# Patient Record
Sex: Female | Born: 1990 | Race: Asian | Hispanic: No | Marital: Married | Smoking: Never smoker
Health system: Southern US, Community
[De-identification: ages and names within clinical notes are randomized; demographics above are authoritative.]

## PROBLEM LIST (undated history)

## (undated) DIAGNOSIS — Z789 Other specified health status: Secondary | ICD-10-CM

## (undated) HISTORY — PX: TONSILLECTOMY: SHX5217

---
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
## 2012-01-05 NOTE — L&D Delivery Note (Signed)
Attestation of Attending Supervision of Advanced Practitioner (CNM/NP): Evaluation and management procedures were performed by the Advanced Practitioner under my supervision and collaboration. I have reviewed the Advanced Practitioner's note and chart, and I agree with the management and plan.  Amber Hubbard H. 1:31 PM   

## 2012-01-05 NOTE — L&D Delivery Note (Signed)
Delivery Note At 4:15 PM a viable female was delivered via Vaginal, Spontaneous Delivery (Presentation: Left Occiput Anterior) w/ posterior (left) compound hand.  APGAR: pending at time of note, baby w/ spontaneous respiration and cry at time of birth ; weight: not available at time of note  .   Placenta status: Intact, Spontaneous.  Cord: 3 vessels with the following complications: None.  Cord pH: not done  Anesthesia: Epidural  Episiotomy: None Lacerations: bilateral hemostatic 1st degree periurethral, and hemostatic Lt lower inner labial- w/o repair Suture Repair: n/a Est. Blood Loss (mL):  Mom to postpartum.  Baby to nursery-stable. Plans to breastfeed, mirena for contraception  Marge Duncans 07/08/2012, 4:37 PM

## 2012-01-12 ENCOUNTER — Ambulatory Visit (INDEPENDENT_AMBULATORY_CARE_PROVIDER_SITE_OTHER): Payer: Self-pay | Admitting: Emergency Medicine

## 2012-01-12 ENCOUNTER — Encounter: Payer: Self-pay | Admitting: Emergency Medicine

## 2012-01-12 VITALS — BP 112/76 | HR 92 | Ht 59.0 in | Wt 95.0 lb

## 2012-01-12 DIAGNOSIS — Z3400 Encounter for supervision of normal first pregnancy, unspecified trimester: Secondary | ICD-10-CM

## 2012-01-12 DIAGNOSIS — O210 Mild hyperemesis gravidarum: Secondary | ICD-10-CM

## 2012-01-12 NOTE — Assessment & Plan Note (Signed)
She will make a lab appt for prenatal labs once she gets her Medicaid card and then follow up with me for routine OB care.

## 2012-01-12 NOTE — Patient Instructions (Addendum)
When you get your Medicaid card, make a lab appt for prenatal labs.  Bring your medicaid card to this appt.  Follow up with me after the labs.

## 2012-01-12 NOTE — Assessment & Plan Note (Signed)
Much improved now that she is out of the first trimester.  Weight is up 1.5lbs from last week.

## 2012-01-12 NOTE — Progress Notes (Signed)
  Subjective:    Patient ID: Amber Hubbard, female    DOB: September 06, 1990, 22 y.o.   MRN: [id number]  HPI Amber Hubbard is here for f/u of hyperemesis gravidarium.  She is a G1P0 at [redacted]w[redacted]d by LMP and early ultrasound.  She reports her nausea and vomiting is significantly improved, despite not taking any medications for nausea.  Able to eat a wider variety of foods.  No vaginal bleeding or discharge.  Cramping has improved with improved vomiting.  She states she should get her Medicaid card on the 10th.  I have reviewed and updated the following as appropriate: allergies and current medications SHx: never smoker  Review of Systems See HPI    Objective:   Physical Exam BP 112/76  Pulse 92  Ht 4\' 11"  (1.499 m)  Wt 95 lb (43.092 kg)  BMI 19.19 kg/m2  LMP 10/09/2011 Gen: alert, cooperative, NAD FHT: 160     Assessment & Plan:

## 2012-02-02 ENCOUNTER — Other Ambulatory Visit: Payer: Self-pay

## 2012-02-04 ENCOUNTER — Other Ambulatory Visit: Payer: Medicaid Other

## 2012-02-04 DIAGNOSIS — Z331 Pregnant state, incidental: Secondary | ICD-10-CM

## 2012-02-04 NOTE — Progress Notes (Signed)
PRENATAL LABS DONE TODAY Amber Hubbard 

## 2012-02-07 LAB — OBSTETRIC PANEL
Eosinophils Absolute: 0 10*3/uL (ref 0.0–0.7)
Hemoglobin: 12.3 g/dL (ref 12.0–15.0)
Hepatitis B Surface Ag: NEGATIVE
Lymphocytes Relative: 23 % (ref 12–46)
Lymphs Abs: 2.1 10*3/uL (ref 0.7–4.0)
MCH: 24.9 pg — ABNORMAL LOW (ref 26.0–34.0)
Monocytes Relative: 5 % (ref 3–12)
Neutro Abs: 6.3 10*3/uL (ref 1.7–7.7)
Neutrophils Relative %: 71 % (ref 43–77)
Platelets: 265 10*3/uL (ref 150–400)
RBC: 4.94 MIL/uL (ref 3.87–5.11)
Rh Type: POSITIVE
Rubella: 0.94 Index — ABNORMAL HIGH (ref <0.90)
WBC: 8.8 10*3/uL (ref 4.0–10.5)

## 2012-02-11 ENCOUNTER — Ambulatory Visit (INDEPENDENT_AMBULATORY_CARE_PROVIDER_SITE_OTHER): Payer: Medicaid Other | Admitting: Emergency Medicine

## 2012-02-11 ENCOUNTER — Other Ambulatory Visit (HOSPITAL_COMMUNITY)
Admission: RE | Admit: 2012-02-11 | Discharge: 2012-02-11 | Disposition: A | Discharge status: Home / Self Care | Payer: Medicaid Other | Source: Ambulatory Visit | Attending: Family Medicine | Admitting: Family Medicine

## 2012-02-11 VITALS — BP 112/70 | Wt 98.0 lb

## 2012-02-11 DIAGNOSIS — Z3400 Encounter for supervision of normal first pregnancy, unspecified trimester: Secondary | ICD-10-CM

## 2012-02-11 DIAGNOSIS — Z113 Encounter for screening for infections with a predominantly sexual mode of transmission: Secondary | ICD-10-CM | POA: Insufficient documentation

## 2012-02-11 NOTE — Patient Instructions (Addendum)
Pregnancy - Second Trimester The second trimester of pregnancy (3 to 6 months) is a period of rapid growth for you and your baby. At the end of the sixth month, your baby is about 9 inches long and weighs 1 1/2 pounds. You will begin to feel the baby move between 18 and 20 weeks of the pregnancy. This is called quickening. Weight gain is faster. A clear fluid (colostrum) may leak out of your breasts. You may feel small contractions of the womb (uterus). This is known as false labor or Braxton-Hicks contractions. This is like a practice for labor when the baby is ready to be born. Usually, the problems with morning sickness have usually passed by the end of your first trimester. Some women develop small dark blotches (called cholasma, mask of pregnancy) on their face that usually goes away after the baby is born. Exposure to the sun makes the blotches worse. Acne may also develop in some pregnant women and pregnant women who have acne, may find that it goes away. PRENATAL EXAMS  Blood work may continue to be done during prenatal exams. These tests are done to check on your health and the probable health of your baby. Blood work is used to follow your blood levels (hemoglobin). Anemia (low hemoglobin) is common during pregnancy. Iron and vitamins are given to help prevent this. You will also be checked for diabetes between 24 and 28 weeks of the pregnancy. Some of the previous blood tests may be repeated.  The size of the uterus is measured during each visit. This is to make sure that the baby is continuing to grow properly according to the dates of the pregnancy.  Your blood pressure is checked every prenatal visit. This is to make sure you are not getting toxemia.  Your urine is checked to make sure you do not have an infection, diabetes or protein in the urine.  Your weight is checked often to make sure gains are happening at the suggested rate. This is to ensure that both you and your baby are growing  normally.  Sometimes, an ultrasound is performed to confirm the proper growth and development of the baby. This is a test which bounces harmless sound waves off the baby so your caregiver can more accurately determine due dates. Sometimes, a specialized test is done on the amniotic fluid surrounding the baby. This test is called an amniocentesis. The amniotic fluid is obtained by sticking a needle into the belly (abdomen). This is done to check the chromosomes in instances where there is a concern about possible genetic problems with the baby. It is also sometimes done near the end of pregnancy if an early delivery is required. In this case, it is done to help make sure the baby's lungs are mature enough for the baby to live outside of the womb. CHANGES OCCURING IN THE SECOND TRIMESTER OF PREGNANCY Your body goes through many changes during pregnancy. They vary from person to person. Talk to your caregiver about changes you notice that you are concerned about.  During the second trimester, you will likely have an increase in your appetite. It is normal to have cravings for certain foods. This varies from person to person and pregnancy to pregnancy.  Your lower abdomen will begin to bulge.  You may have to urinate more often because the uterus and baby are pressing on your bladder. It is also common to get more bladder infections during pregnancy (pain with urination). You can help this by   drinking lots of fluids and emptying your bladder before and after intercourse.  You may begin to get stretch marks on your hips, abdomen, and breasts. These are normal changes in the body during pregnancy. There are no exercises or medications to take that prevent this change.  You may begin to develop swollen and bulging veins (varicose veins) in your legs. Wearing support hose, elevating your feet for 15 minutes, 3 to 4 times a day and limiting salt in your diet helps lessen the problem.  Heartburn may develop  as the uterus grows and pushes up against the stomach. Antacids recommended by your caregiver helps with this problem. Also, eating smaller meals 4 to 5 times a day helps.  Constipation can be treated with a stool softener or adding bulk to your diet. Drinking lots of fluids, vegetables, fruits, and whole grains are helpful.  Exercising is also helpful. If you have been very active up until your pregnancy, most of these activities can be continued during your pregnancy. If you have been less active, it is helpful to start an exercise program such as walking.  Hemorrhoids (varicose veins in the rectum) may develop at the end of the second trimester. Warm sitz baths and hemorrhoid cream recommended by your caregiver helps hemorrhoid problems.  Backaches may develop during this time of your pregnancy. Avoid heavy lifting, wear low heal shoes and practice good posture to help with backache problems.  Some pregnant women develop tingling and numbness of their hand and fingers because of swelling and tightening of ligaments in the wrist (carpel tunnel syndrome). This goes away after the baby is born.  As your breasts enlarge, you may have to get a bigger bra. Get a comfortable, cotton, support bra. Do not get a nursing bra until the last month of the pregnancy if you will be nursing the baby.  You may get a dark line from your belly button to the pubic area called the linea nigra.  You may develop rosy cheeks because of increase blood flow to the face.  You may develop spider looking lines of the face, neck, arms and chest. These go away after the baby is born. HOME CARE INSTRUCTIONS   It is extremely important to avoid all smoking, herbs, alcohol, and unprescribed drugs during your pregnancy. These chemicals affect the formation and growth of the baby. Avoid these chemicals throughout the pregnancy to ensure the delivery of a healthy infant.  Most of your home care instructions are the same as  suggested for the first trimester of your pregnancy. Keep your caregiver's appointments. Follow your caregiver's instructions regarding medication use, exercise and diet.  During pregnancy, you are providing food for you and your baby. Continue to eat regular, well-balanced meals. Choose foods such as meat, fish, milk and other low fat dairy products, vegetables, fruits, and whole-grain breads and cereals. Your caregiver will tell you of the ideal weight gain.  A physical sexual relationship may be continued up until near the end of pregnancy if there are no other problems. Problems could include early (premature) leaking of amniotic fluid from the membranes, vaginal bleeding, abdominal pain, or other medical or pregnancy problems.  Exercise regularly if there are no restrictions. Check with your caregiver if you are unsure of the safety of some of your exercises. The greatest weight gain will occur in the last 2 trimesters of pregnancy. Exercise will help you:  Control your weight.  Get you in shape for labor and delivery.  Lose weight   after you have the baby.  Wear a good support or jogging bra for breast tenderness during pregnancy. This may help if worn during sleep. Pads or tissues may be used in the bra if you are leaking colostrum.  Do not use hot tubs, steam rooms or saunas throughout the pregnancy.  Wear your seat belt at all times when driving. This protects you and your baby if you are in an accident.  Avoid raw meat, uncooked cheese, cat litter boxes and soil used by cats. These carry germs that can cause birth defects in the baby.  The second trimester is also a good time to visit your dentist for your dental health if this has not been done yet. Getting your teeth cleaned is OK. Use a soft toothbrush. Brush gently during pregnancy.  It is easier to loose urine during pregnancy. Tightening up and strengthening the pelvic muscles will help with this problem. Practice stopping your  urination while you are going to the bathroom. These are the same muscles you need to strengthen. It is also the muscles you would use as if you were trying to stop from passing gas. You can practice tightening these muscles up 10 times a set and repeating this about 3 times per day. Once you know what muscles to tighten up, do not perform these exercises during urination. It is more likely to contribute to an infection by backing up the urine.  Ask for help if you have financial, counseling or nutritional needs during pregnancy. Your caregiver will be able to offer counseling for these needs as well as refer you for other special needs.  Your skin may become oily. If so, wash your face with mild soap, use non-greasy moisturizer and oil or cream based makeup. MEDICATIONS AND DRUG USE IN PREGNANCY  Take prenatal vitamins as directed. The vitamin should contain 1 milligram of folic acid. Keep all vitamins out of reach of children. Only a couple vitamins or tablets containing iron may be fatal to a baby or young child when ingested.  Avoid use of all medications, including herbs, over-the-counter medications, not prescribed or suggested by your caregiver. Only take over-the-counter or prescription medicines for pain, discomfort, or fever as directed by your caregiver. Do not use aspirin.  Let your caregiver also know about herbs you may be using.  Alcohol is related to a number of birth defects. This includes fetal alcohol syndrome. All alcohol, in any form, should be avoided completely. Smoking will cause low birth rate and premature babies.  Street or illegal drugs are very harmful to the baby. They are absolutely forbidden. A baby born to an addicted mother will be addicted at birth. The baby will go through the same withdrawal an adult does. SEEK MEDICAL CARE IF:  You have any concerns or worries during your pregnancy. It is better to call with your questions if you feel they cannot wait, rather  than worry about them. SEEK IMMEDIATE MEDICAL CARE IF:   An unexplained oral temperature above 102 F (38.9 C) develops, or as your caregiver suggests.  You have leaking of fluid from the vagina (birth canal). If leaking membranes are suspected, take your temperature and tell your caregiver of this when you call.  There is vaginal spotting, bleeding, or passing clots. Tell your caregiver of the amount and how many pads are used. Light spotting in pregnancy is common, especially following intercourse.  You develop a bad smelling vaginal discharge with a change in the color from clear   to white.  You continue to feel sick to your stomach (nauseated) and have no relief from remedies suggested. You vomit blood or coffee ground-like materials.  You lose more than 2 pounds of weight or gain more than 2 pounds of weight over 1 week, or as suggested by your caregiver.  You notice swelling of your face, hands, feet, or legs.  You get exposed to German measles and have never had them.  You are exposed to fifth disease or chickenpox.  You develop belly (abdominal) pain. Round ligament discomfort is a common non-cancerous (benign) cause of abdominal pain in pregnancy. Your caregiver still must evaluate you.  You develop a bad headache that does not go away.  You develop fever, diarrhea, pain with urination, or shortness of breath.  You develop visual problems, blurry, or double vision.  You fall or are in a car accident or any kind of trauma.  There is mental or physical violence at home. Document Released: 12/15/2000 Document Revised: 03/15/2011 Document Reviewed: 06/19/2008 [employer] Patient Information 2013 [employer].  

## 2012-02-11 NOTE — Addendum Note (Signed)
Addended byArlyss Repress on: 02/11/2012 04:25 PM   Modules accepted: Orders

## 2012-02-11 NOTE — Progress Notes (Signed)
S: 22 yo G1 at [redacted]w[redacted]d by LMP and early ultrasound presenting for routine care/initial ob appt.  No vaginal bleeding.  Moderate amount of thin white discharge without odor or itching.  Starting to feel some flutters, but unsure if it is the baby or her stomach.  No further n/v.   PMHx: negative PSHx: negative Social: no alcohol, tobacco or drugs  PMH form reviewed and no red flags. PHQ-9: 6 (3 for fatigue, 1 for anhedonia, sleep, and concentration)  O: see flowsheet Back: no erythema, swelling, obvious deformity; vertebra non-tender, tender to palpation along left paraspinous muscles and above left iliac crest Pelvic: normal external genitalia, normal vagina, physiologic discharge noted, normal cervix with extropion  A/P: 22 yo G1 at [redacted]w[redacted]d by LMP and early ultrasound - continue prenatal vitamin - discussed weight gain of about 30lbs during pregnancy - no indication for early glucola - cervical cultures collected today - prenatal labs reviewed: MMR equivocal, discussed avoiding people with rash and fever, will immunize PP - information on teen mom programs provided - anatomy scan scheduled - will flu shot at next appt - f/u in 4 weeks in [facility] clinic

## 2012-02-17 ENCOUNTER — Ambulatory Visit (HOSPITAL_COMMUNITY): Admission: RE | Admit: 2012-02-17 | Payer: Medicaid Other | Source: Ambulatory Visit

## 2012-02-22 ENCOUNTER — Ambulatory Visit (HOSPITAL_COMMUNITY)
Admission: RE | Admit: 2012-02-22 | Discharge: 2012-02-22 | Disposition: A | Discharge status: Home / Self Care | Payer: Medicaid Other | Source: Ambulatory Visit | Attending: Family Medicine | Admitting: Family Medicine

## 2012-02-22 ENCOUNTER — Other Ambulatory Visit: Payer: Self-pay | Admitting: Emergency Medicine

## 2012-02-22 DIAGNOSIS — Z363 Encounter for antenatal screening for malformations: Secondary | ICD-10-CM | POA: Insufficient documentation

## 2012-02-22 DIAGNOSIS — Z3400 Encounter for supervision of normal first pregnancy, unspecified trimester: Secondary | ICD-10-CM

## 2012-02-22 DIAGNOSIS — Z1389 Encounter for screening for other disorder: Secondary | ICD-10-CM | POA: Insufficient documentation

## 2012-02-22 DIAGNOSIS — O358XX0 Maternal care for other (suspected) fetal abnormality and damage, not applicable or unspecified: Secondary | ICD-10-CM | POA: Insufficient documentation

## 2012-03-20 ENCOUNTER — Ambulatory Visit (INDEPENDENT_AMBULATORY_CARE_PROVIDER_SITE_OTHER): Payer: Medicaid Other | Admitting: Emergency Medicine

## 2012-03-20 VITALS — BP 111/71 | Wt 105.0 lb

## 2012-03-20 DIAGNOSIS — R1011 Right upper quadrant pain: Secondary | ICD-10-CM

## 2012-03-20 DIAGNOSIS — Z3402 Encounter for supervision of normal first pregnancy, second trimester: Secondary | ICD-10-CM

## 2012-03-20 DIAGNOSIS — Z3400 Encounter for supervision of normal first pregnancy, unspecified trimester: Secondary | ICD-10-CM

## 2012-03-20 LAB — CBC
Hemoglobin: 12 g/dL (ref 12.0–15.0)
MCH: 25.5 pg — ABNORMAL LOW (ref 26.0–34.0)
MCHC: 33.5 g/dL (ref 30.0–36.0)
Platelets: 288 10*3/uL (ref 150–400)
RBC: 4.71 MIL/uL (ref 3.87–5.11)

## 2012-03-20 LAB — COMPREHENSIVE METABOLIC PANEL
Albumin: 4.1 g/dL (ref 3.5–5.2)
Alkaline Phosphatase: 70 U/L (ref 39–117)
BUN: 10 mg/dL (ref 6–23)
CO2: 29 mEq/L (ref 19–32)
Calcium: 9.2 mg/dL (ref 8.4–10.5)
Chloride: 101 mEq/L (ref 96–112)
Glucose, Bld: 73 mg/dL (ref 70–99)
Potassium: 4 mEq/L (ref 3.5–5.3)
Sodium: 136 mEq/L (ref 135–145)
Total Protein: 6.9 g/dL (ref 6.0–8.3)

## 2012-03-20 NOTE — Progress Notes (Signed)
S: 22 yo G1P0 at [redacted]w[redacted]d by LMP.  Complaining of RUQ pain for the last week or so, described as colicky, occurs after meals, lasts for about 2 hours then resolves; no fevers, n/v.  +FM.  No vaginal bleeding, discharge, or cramping.   O: see flowsheet Abd: gravid, soft, ND, tender in RUQ  A/P: 22 yo G1P0 at [redacted]w[redacted]d by LMP - continue prenatal vitamin - anatomy scan reviewed and wnl - RUQ concerning for gallstones - will check CMP, CBC, and abd ultrasound - discussed smaller meals and avoiding fatty foods - reviewed bleeding precautions and warning signs for gallbladder disease - follow up in 4 weeks in OB clinic or sooner if needed

## 2012-03-20 NOTE — Patient Instructions (Addendum)
It was nice to see you! I think you pain is from your gallbladder.  We are going to check an ultrasound of the gallbladder and some labs. If the pain is worse, does not get better, you have fevers, your eyes start to look yellow, or you have nausea and vomiting, please come back or go to the ER. If you have abdominal cramping and vaginal bleeding, please go to [facility] to be evaluated. Follow up in 4 weeks in [facility], or sooner if needed.

## 2012-03-23 ENCOUNTER — Ambulatory Visit (HOSPITAL_COMMUNITY)
Admission: RE | Admit: 2012-03-23 | Discharge: 2012-03-23 | Disposition: A | Discharge status: Home / Self Care | Payer: Medicaid Other | Source: Ambulatory Visit | Attending: Family Medicine | Admitting: Family Medicine

## 2012-03-23 ENCOUNTER — Telehealth: Payer: Self-pay | Admitting: Emergency Medicine

## 2012-03-23 DIAGNOSIS — O99891 Other specified diseases and conditions complicating pregnancy: Secondary | ICD-10-CM | POA: Insufficient documentation

## 2012-03-23 DIAGNOSIS — N2889 Other specified disorders of kidney and ureter: Secondary | ICD-10-CM | POA: Insufficient documentation

## 2012-03-23 DIAGNOSIS — R1011 Right upper quadrant pain: Secondary | ICD-10-CM | POA: Insufficient documentation

## 2012-03-23 MED ORDER — RANITIDINE HCL 300 MG PO TABS: 300.0000 mg | ORAL_TABLET | Freq: Every day | ORAL | Status: DC

## 2012-03-23 NOTE — Telephone Encounter (Signed)
Called to discuss lab results with patient.  Lab test and ultrasound show no problems with the gallbladder or liver.  She continues to have pain after eating or drinking anything.  She thinks it is getting worse.  She has an appt scheduled with OB clinic 4/10.  I will send in a prescription for Zantac to treat presumptive heartburn/gastritis.  She is also to call the front desk for an appt to be seen next week.  She expressed understanding and agreement.

## 2012-04-13 ENCOUNTER — Ambulatory Visit (INDEPENDENT_AMBULATORY_CARE_PROVIDER_SITE_OTHER): Payer: Medicaid Other | Admitting: Family Medicine

## 2012-04-13 ENCOUNTER — Encounter: Payer: Self-pay | Admitting: Family Medicine

## 2012-04-13 ENCOUNTER — Other Ambulatory Visit: Payer: Self-pay | Admitting: Family Medicine

## 2012-04-13 VITALS — BP 115/60 | Temp 98.1°F | Wt 110.0 lb

## 2012-04-13 DIAGNOSIS — R1011 Right upper quadrant pain: Secondary | ICD-10-CM

## 2012-04-13 DIAGNOSIS — Z3402 Encounter for supervision of normal first pregnancy, second trimester: Secondary | ICD-10-CM

## 2012-04-13 DIAGNOSIS — O0001 Abdominal pregnancy with intrauterine pregnancy: Secondary | ICD-10-CM

## 2012-04-13 DIAGNOSIS — R1013 Epigastric pain: Secondary | ICD-10-CM | POA: Insufficient documentation

## 2012-04-13 DIAGNOSIS — Z3400 Encounter for supervision of normal first pregnancy, unspecified trimester: Secondary | ICD-10-CM

## 2012-04-13 LAB — RPR

## 2012-04-13 LAB — CBC
Hemoglobin: 11.5 g/dL — ABNORMAL LOW (ref 12.0–15.0)
MCH: 25.7 pg — ABNORMAL LOW (ref 26.0–34.0)
MCHC: 33.5 g/dL (ref 30.0–36.0)
Platelets: 263 10*3/uL (ref 150–400)
RBC: 4.48 MIL/uL (ref 3.87–5.11)

## 2012-04-13 LAB — HIV ANTIBODY (ROUTINE TESTING W REFLEX): HIV: NONREACTIVE

## 2012-04-13 LAB — GLUCOSE, CAPILLARY: Glucose-Capillary: 77 mg/dL (ref 70–99)

## 2012-04-13 MED ORDER — RANITIDINE HCL 150 MG PO TABS: 150.0000 mg | ORAL_TABLET | Freq: Two times a day (BID) | ORAL | Status: DC

## 2012-04-13 NOTE — Assessment & Plan Note (Addendum)
Patient with mostly epigastric pain associated with eating. Likely related to GERD as patient had Abd [place] negative for gall stones. Plan: will start zantac 150 mg BID and continue to follow

## 2012-04-13 NOTE — Patient Instructions (Signed)
Pregnancy - Third Trimester  The third trimester of pregnancy (the last 3 months) is a period of the most rapid growth for you and your baby. The baby approaches a length of 20 inches and a weight of 6 to 10 pounds. The baby is adding on fat and getting ready for life outside your body. While inside, babies have periods of sleeping and waking, suck their thumbs, and hiccups. You can often feel small contractions of the uterus. This is false labor. It is also called Braxton-Hicks contractions. This is like a practice for labor. The usual problems in this stage of pregnancy include more difficulty breathing, swelling of the hands and feet from water retention, and having to urinate more often because of the uterus and baby pressing on your bladder.   PRENATAL EXAMS  · Blood work may continue to be done during prenatal exams. These tests are done to check on your health and the probable health of your baby. Blood work is used to follow your blood levels (hemoglobin). Anemia (low hemoglobin) is common during pregnancy. Iron and vitamins are given to help prevent this. You may also continue to be checked for diabetes. Some of the past blood tests may be done again.  · The size of the uterus is measured during each visit. This makes sure your baby is growing properly according to your pregnancy dates.  · Your blood pressure is checked every prenatal visit. This is to make sure you are not getting toxemia.  · Your urine is checked every prenatal visit for infection, diabetes and protein.  · Your weight is checked at each visit. This is done to make sure gains are happening at the suggested rate and that you and your baby are growing normally.  · Sometimes, an ultrasound is performed to confirm the position and the proper growth and development of the baby. This is a test done that bounces harmless sound waves off the baby so your caregiver can more accurately determine due dates.  · Discuss the type of pain medication and  anesthesia you will have during your labor and delivery.  · Discuss the possibility and anesthesia if a Cesarean Section might be necessary.  · Inform your caregiver if there is any mental or physical violence at home.  Sometimes, a specialized non-stress test, contraction stress test and biophysical profile are done to make sure the baby is not having a problem. Checking the amniotic fluid surrounding the baby is called an amniocentesis. The amniotic fluid is removed by sticking a needle into the belly (abdomen). This is sometimes done near the end of pregnancy if an early delivery is required. In this case, it is done to help make sure the baby's lungs are mature enough for the baby to live outside of the womb. If the lungs are not mature and it is unsafe to deliver the baby, an injection of cortisone medication is given to the mother 1 to 2 days before the delivery. This helps the baby's lungs mature and makes it safer to deliver the baby.  CHANGES OCCURING IN THE THIRD TRIMESTER OF PREGNANCY  Your body goes through many changes during pregnancy. They vary from person to person. Talk to your caregiver about changes you notice and are concerned about.  · During the last trimester, you have probably had an increase in your appetite. It is normal to have cravings for certain foods. This varies from person to person and pregnancy to pregnancy.  · You may begin to   get stretch marks on your hips, abdomen, and breasts. These are normal changes in the body during pregnancy. There are no exercises or medications to take which prevent this change.  · Constipation may be treated with a stool softener or adding bulk to your diet. Drinking lots of fluids, fiber in vegetables, fruits, and whole grains are helpful.  · Exercising is also helpful. If you have been very active up until your pregnancy, most of these activities can be continued during your pregnancy. If you have been less active, it is helpful to start an exercise  program such as walking. Consult your caregiver before starting exercise programs.  · Avoid all smoking, alcohol, un-prescribed drugs, herbs and "street drugs" during your pregnancy. These chemicals affect the formation and growth of the baby. Avoid chemicals throughout the pregnancy to ensure the delivery of a healthy infant.  · Backache, varicose veins and hemorrhoids may develop or get worse.  · You will tire more easily in the third trimester, which is normal.  · The baby's movements may be stronger and more often.  · You may become short of breath easily.  · Your belly button may stick out.  · A yellow discharge may leak from your breasts called colostrum.  · You may have a bloody mucus discharge. This usually occurs a few days to a week before labor begins.  HOME CARE INSTRUCTIONS   · Keep your caregiver's appointments. Follow your caregiver's instructions regarding medication use, exercise, and diet.  · During pregnancy, you are providing food for you and your baby. Continue to eat regular, well-balanced meals. Choose foods such as meat, fish, milk and other low fat dairy products, vegetables, fruits, and whole-grain breads and cereals. Your caregiver will tell you of the ideal weight gain.  · A physical sexual relationship may be continued throughout pregnancy if there are no other problems such as early (premature) leaking of amniotic fluid from the membranes, vaginal bleeding, or belly (abdominal) pain.  · Exercise regularly if there are no restrictions. Check with your caregiver if you are unsure of the safety of your exercises. Greater weight gain will occur in the last 2 trimesters of pregnancy. Exercising helps:  · Control your weight.  · Get you in shape for labor and delivery.  · You lose weight after you deliver.  · Rest a lot with legs elevated, or as needed for leg cramps or low back pain.  · Wear a good support or jogging bra for breast tenderness during pregnancy. This may help if worn during  sleep. Pads or tissues may be used in the bra if you are leaking colostrum.  · Do not use hot tubs, steam rooms, or saunas.  · Wear your seat belt when driving. This protects you and your baby if you are in an accident.  · Avoid raw meat, cat litter boxes and soil used by cats. These carry germs that can cause birth defects in the baby.  · It is easier to loose urine during pregnancy. Tightening up and strengthening the pelvic muscles will help with this problem. You can practice stopping your urination while you are going to the bathroom. These are the same muscles you need to strengthen. It is also the muscles you would use if you were trying to stop from passing gas. You can practice tightening these muscles up 10 times a set and repeating this about 3 times per day. Once you know what muscles to tighten up, do not perform these   exercises during urination. It is more likely to cause an infection by backing up the urine.  · Ask for help if you have financial, counseling or nutritional needs during pregnancy. Your caregiver will be able to offer counseling for these needs as well as refer you for other special needs.  · Make a list of emergency phone numbers and have them available.  · Plan on getting help from family or friends when you go home from the hospital.  · Make a trial run to the hospital.  · Take prenatal classes with the father to understand, practice and ask questions about the labor and delivery.  · Prepare the baby's room/nursery.  · Do not travel out of the city unless it is absolutely necessary and with the advice of your caregiver.  · Wear only low or no heal shoes to have better balance and prevent falling.  MEDICATIONS AND DRUG USE IN PREGNANCY  · Take prenatal vitamins as directed. The vitamin should contain 1 milligram of folic acid. Keep all vitamins out of reach of children. Only a couple vitamins or tablets containing iron may be fatal to a baby or young child when ingested.  · Avoid use  of all medications, including herbs, over-the-counter medications, not prescribed or suggested by your caregiver. Only take over-the-counter or prescription medicines for pain, discomfort, or fever as directed by your caregiver. Do not use aspirin, ibuprofen (Motrin®, Advil®, Nuprin®) or naproxen (Aleve®) unless OK'd by your caregiver.  · Let your caregiver also know about herbs you may be using.  · Alcohol is related to a number of birth defects. This includes fetal alcohol syndrome. All alcohol, in any form, should be avoided completely. Smoking will cause low birth rate and premature babies.  · Street/illegal drugs are very harmful to the baby. They are absolutely forbidden. A baby born to an addicted mother will be addicted at birth. The baby will go through the same withdrawal an adult does.  SEEK MEDICAL CARE IF:  You have any concerns or worries during your pregnancy. It is better to call with your questions if you feel they cannot wait, rather than worry about them.  DECISIONS ABOUT CIRCUMCISION  You may or may not know the sex of your baby. If you know your baby is a boy, it may be time to think about circumcision. Circumcision is the removal of the foreskin of the penis. This is the skin that covers the sensitive end of the penis. There is no proven medical need for this. Often this decision is made on what is popular at the time or based upon religious beliefs and social issues. You can discuss these issues with your caregiver or pediatrician.  SEEK IMMEDIATE MEDICAL CARE IF:   · An unexplained oral temperature above 102° F (38.9° C) develops, or as your caregiver suggests.  · You have leaking of fluid from the vagina (birth canal). If leaking membranes are suspected, take your temperature and tell your caregiver of this when you call.  · There is vaginal spotting, bleeding or passing clots. Tell your caregiver of the amount and how many pads are used.  · You develop a bad smelling vaginal discharge with  a change in the color from clear to white.  · You develop vomiting that lasts more than 24 hours.  · You develop chills or fever.  · You develop shortness of breath.  · You develop burning on urination.  · You loose more than 2 pounds of weight   or gain more than 2 pounds of weight or as suggested by your caregiver.  · You notice sudden swelling of your face, hands, and feet or legs.  · You develop belly (abdominal) pain. Round ligament discomfort is a common non-cancerous (benign) cause of abdominal pain in pregnancy. Your caregiver still must evaluate you.  · You develop a severe headache that does not go away.  · You develop visual problems, blurred or double vision.  · If you have not felt your baby move for more than 1 hour. If you think the baby is not moving as much as usual, eat something with sugar in it and lie down on your left side for an hour. The baby should move at least 4 to 5 times per hour. Call right away if your baby moves less than that.  · You fall, are in a car accident or any kind of trauma.  · There is mental or physical violence at home.  Document Released: 12/15/2000 Document Revised: 03/15/2011 Document Reviewed: 06/19/2008  [employer]® Patient Information ©2013 [employer].

## 2012-04-13 NOTE — Progress Notes (Signed)
Orne Ksor is a 22 y.o. G1P0 at [redacted]w[redacted]d for routine follow up.  She reports continued RUQ and epigastric abdominal pain mostly associated with eating. Denies burning and sour taste in back of mouth. Has had some nausea, though no vomiting and weight gain has improved. PHQ9 score of 7.  See flow sheet for details.  A/P: Pregnancy at [redacted]w[redacted]d.  Doing well.   Will treat likely GERD with zantac 150 mg BID. Pregnancy issues include inadequate weight gain, though now with weight gain following initial weight loss. Childbirth and education classes were not offered. Preterm labor precautions reviewed. 28 week labs drawn (CBC, HIV, RPR, 1 hr GTT) Follow up 2 weeks with Dr. Elwyn Reach.

## 2012-04-13 NOTE — Progress Notes (Signed)
Patient seen and examined by me in [facility], in conjunction with Dr Birdie Sons.  She reports that her abdominal pain continues to aggravate her after meals; by exam it is localized under xiphoid.  No Murphy's sign.  Good fetal movement noted daily.  Discussed preterm labor precautions and kick counts.  For 1hGTT today, 28-week labs and follow up in 3 weeks with primary.  For Tdap at that time.  Paula Compton, MD

## 2012-05-02 ENCOUNTER — Ambulatory Visit (INDEPENDENT_AMBULATORY_CARE_PROVIDER_SITE_OTHER): Payer: Medicaid Other | Admitting: Emergency Medicine

## 2012-05-02 VITALS — BP 106/67 | Wt 114.8 lb

## 2012-05-02 DIAGNOSIS — Z3400 Encounter for supervision of normal first pregnancy, unspecified trimester: Secondary | ICD-10-CM

## 2012-05-02 DIAGNOSIS — Z3403 Encounter for supervision of normal first pregnancy, third trimester: Secondary | ICD-10-CM

## 2012-05-02 MED ORDER — TETANUS-DIPHTH-ACELL PERTUSSIS 5-2.5-18.5 LF-MCG/0.5 IM SUSP: 0.5000 mL | Freq: Once | INTRAMUSCULAR | Status: AC

## 2012-05-02 NOTE — Patient Instructions (Addendum)
If you have regular contractions that are 3-5 minutes apart for at least one hour, bleeding or fluid from your vagina, or you do not feel the baby moving enough, please go to the [facility].  Follow up in 2 weeks. 

## 2012-05-02 NOTE — Addendum Note (Signed)
Addended by: Barnie Alderman on: 05/02/2012 02:47 PM   Modules accepted: Orders

## 2012-05-02 NOTE — Progress Notes (Signed)
S: 22 yo G1P0 at [redacted]w[redacted]d by LMP.  No complaints today.  States the stomach pain is improving.  She is concerned about her weight gain.  Good fetal movement.  No vaginal bleeding or discharge.  No contractions. Pediatrician: unsure, they may be moving to [employer]: breast; attended class last week. Birth control: unsure, thinking about depo shots, want to wait 5 years before next baby  O: see flowsheet  A/P: 22 yo G1P0 at [redacted]w[redacted]d - continue prenatal vitamin - 28 week labs reviewed - mild anemia, discussed iron intake - 1hr GTT wnl at 77 - TDaP today - discussed weight gain, current up 10lbs from pregravid weight; expect 1-1.5lbs of weight gain from here on out - reviewed preterm labor precautions and kick counts - Mirena or Nexplanon would be covered under pregnancy medicaid if placed at [facility] visit. - follow up in 2 weeks.

## 2012-05-16 ENCOUNTER — Ambulatory Visit (INDEPENDENT_AMBULATORY_CARE_PROVIDER_SITE_OTHER): Payer: Medicaid Other | Admitting: Emergency Medicine

## 2012-05-16 VITALS — BP 109/54 | Temp 98.0°F | Wt 117.0 lb

## 2012-05-16 DIAGNOSIS — O26849 Uterine size-date discrepancy, unspecified trimester: Secondary | ICD-10-CM

## 2012-05-16 DIAGNOSIS — Z3401 Encounter for supervision of normal first pregnancy, first trimester: Secondary | ICD-10-CM

## 2012-05-16 DIAGNOSIS — Z3400 Encounter for supervision of normal first pregnancy, unspecified trimester: Secondary | ICD-10-CM

## 2012-05-16 DIAGNOSIS — O26843 Uterine size-date discrepancy, third trimester: Secondary | ICD-10-CM | POA: Insufficient documentation

## 2012-05-16 NOTE — Patient Instructions (Addendum)
If you have regular contractions that are 3-5 minutes apart for at least one hour, bleeding or fluid from your vagina, or you do not feel the baby moving enough, please go to the [facility].  Follow up in 2 weeks. 

## 2012-05-16 NOTE — Progress Notes (Signed)
S: 22 yo G1P0 at [redacted]w[redacted]d by LMP.  No concerns today.  Has had a few mild contractions 1-2 times a week.  Good fetal movement.  No vaginal bleeding or discharge.  O: see flowsheet  A/P: 22 yo G1P0 at [redacted]w[redacted]d by LMP - continue prenatal vitamin - will check growth ultrasound as she is measuring smaller than dates - reviewed preterm labor and kick counts - f/u in 2 weeks

## 2012-05-19 ENCOUNTER — Ambulatory Visit (HOSPITAL_COMMUNITY)
Admission: RE | Admit: 2012-05-19 | Discharge: 2012-05-19 | Disposition: A | Discharge status: Home / Self Care | Payer: Medicaid Other | Source: Ambulatory Visit | Attending: Family Medicine | Admitting: Family Medicine

## 2012-05-19 ENCOUNTER — Other Ambulatory Visit: Payer: Self-pay | Admitting: Emergency Medicine

## 2012-05-19 DIAGNOSIS — O26843 Uterine size-date discrepancy, third trimester: Secondary | ICD-10-CM

## 2012-05-19 DIAGNOSIS — Z3401 Encounter for supervision of normal first pregnancy, first trimester: Secondary | ICD-10-CM

## 2012-05-19 DIAGNOSIS — O365990 Maternal care for other known or suspected poor fetal growth, unspecified trimester, not applicable or unspecified: Secondary | ICD-10-CM | POA: Insufficient documentation

## 2012-05-19 DIAGNOSIS — Z3689 Encounter for other specified antenatal screening: Secondary | ICD-10-CM | POA: Insufficient documentation

## 2012-06-02 ENCOUNTER — Ambulatory Visit (INDEPENDENT_AMBULATORY_CARE_PROVIDER_SITE_OTHER): Payer: Medicaid Other | Admitting: Emergency Medicine

## 2012-06-02 VITALS — BP 106/69 | Wt 120.0 lb

## 2012-06-02 DIAGNOSIS — Z3400 Encounter for supervision of normal first pregnancy, unspecified trimester: Secondary | ICD-10-CM

## 2012-06-02 DIAGNOSIS — Z3403 Encounter for supervision of normal first pregnancy, third trimester: Secondary | ICD-10-CM

## 2012-06-02 NOTE — Patient Instructions (Addendum)
If you have regular contractions that are 3-5 minutes apart for at least one hour, bleeding or fluid from your vagina, or you do not feel the baby moving enough, please go to the [facility].  Follow up in 2 weeks. 

## 2012-06-02 NOTE — Progress Notes (Signed)
S: 22 yo G1P0 at [redacted]w[redacted]d by LMP.  Reports some trouble sleeping due to baby kicking.  Also with leg cramps 3-4x per week.  Good FM.  Gets rare abdominal tightening, maybe twice a day.  No vaginal bleeding, discharge, or loss of fluid. Peds: [facility]  O: see flowsheet  A/P: 22 yo G1P0 at [redacted]w[redacted]d by LMP - continue prenatal vitamin - growth ultrasound reviewed and wnl - appropriate weight gain and interval growth - discussed potassium intake to help with cramps and moderating her activity level - reviewed preterm labor and kick counts - f/u in 2 weeks for GBS and cultures

## 2012-06-19 ENCOUNTER — Ambulatory Visit (INDEPENDENT_AMBULATORY_CARE_PROVIDER_SITE_OTHER): Payer: Medicaid Other | Admitting: Emergency Medicine

## 2012-06-19 ENCOUNTER — Other Ambulatory Visit (HOSPITAL_COMMUNITY)
Admission: RE | Admit: 2012-06-19 | Discharge: 2012-06-19 | Disposition: A | Discharge status: Home / Self Care | Payer: Medicaid Other | Source: Ambulatory Visit | Attending: Family Medicine | Admitting: Family Medicine

## 2012-06-19 VITALS — BP 106/70 | Wt 123.2 lb

## 2012-06-19 DIAGNOSIS — N898 Other specified noninflammatory disorders of vagina: Secondary | ICD-10-CM

## 2012-06-19 DIAGNOSIS — Z3400 Encounter for supervision of normal first pregnancy, unspecified trimester: Secondary | ICD-10-CM

## 2012-06-19 DIAGNOSIS — Z113 Encounter for screening for infections with a predominantly sexual mode of transmission: Secondary | ICD-10-CM | POA: Insufficient documentation

## 2012-06-19 DIAGNOSIS — Z3403 Encounter for supervision of normal first pregnancy, third trimester: Secondary | ICD-10-CM

## 2012-06-19 LAB — POCT WET PREP (WET MOUNT): Clue Cells Wet Prep Whiff POC: NEGATIVE

## 2012-06-19 NOTE — Patient Instructions (Addendum)
If you have regular contractions that are 3-5 minutes apart for at least one hour, bleeding or fluid from your vagina, or you do not feel the baby moving enough, please go to the [facility] [address]  Follow up in OB clinic in 1 week.

## 2012-06-19 NOTE — Progress Notes (Signed)
S: 22 yo G1P0 at [redacted]w[redacted]d by LMP.  Reports some thick yellowish discharge, some odor, no itching.  +FM.  1-2 contractions per day.  No vaginal bleeding or loss of fluid.  O: see flowsheet Pelvic: normal external genitalia, normal vagina with small amount of white discharge, normal gravid cervix, cultures collected  A/P: 22 yo G1P0 at [redacted]w[redacted]d by LMP - continue prenatal vitamin - cultures collected today - reviewed preterm labor precautions and kick counts - follow up in OB clinic in 1 week

## 2012-06-21 LAB — STREP B DNA PROBE: GBSP: NEGATIVE

## 2012-06-29 ENCOUNTER — Ambulatory Visit (INDEPENDENT_AMBULATORY_CARE_PROVIDER_SITE_OTHER): Payer: Medicaid Other | Admitting: Family Medicine

## 2012-06-29 VITALS — BP 107/73 | Wt 124.0 lb

## 2012-06-29 DIAGNOSIS — Z3400 Encounter for supervision of normal first pregnancy, unspecified trimester: Secondary | ICD-10-CM

## 2012-06-29 DIAGNOSIS — Z3403 Encounter for supervision of normal first pregnancy, third trimester: Secondary | ICD-10-CM

## 2012-06-29 NOTE — Progress Notes (Signed)
Patient seen in [facility] @[redacted]w[redacted]d ; reports no regularity to contractions, not intense. Good fetal movement. No discharge or bleeding, trace ankle edema and trouble with sleep due to generalized achiness.  No fevers or chills. Plans to breastfeed her daughter.  Married, has support of her husband and patient's extended family.  Is ready at home to receive baby.  On exam: Le[place] with R fetal lie, vertex. Discussed kick counts, reasons to go to [facility] (suspected ROM, poor kick counts, contraction pattern, etc.  Discussed results of GBS and cervical culture (both negative). Follow up with Dr Elwyn Reach for prenatal care in 1 week. Paula Compton, MD

## 2012-06-29 NOTE — Patient Instructions (Addendum)
It was a pleasure to see you today in prenatal clinic.  YOu are 37 weeks and 5 days along in your pregnancy.   Keep taking prenatal vitamins.   Remember the kick counts that we talked about; 5 movements in one hour, if you are not feeling her move normally.  If she does not move 5 times in an hour over 2 hours of checking, then this is a reason to go to [facility] for monitoring.   If you feel a fluid leak or gush, this may mean you have broken your water and is a reason to go to [facility].  Contractions that are intense enough to make it difficult to talk, every 10 minutes for at least an hour.  Please schedule your next appointment in our office with Dr Elwyn Reach in 1 week.   PRENATAL VISIT WITH DR BOOTH IN 1 WEEK, MAKE SECOND APPOINTMENT WITH DR BOOTH IN 2 WEEKS.

## 2012-06-29 NOTE — Assessment & Plan Note (Signed)
See Vitals and Notes. 

## 2012-07-05 ENCOUNTER — Ambulatory Visit (INDEPENDENT_AMBULATORY_CARE_PROVIDER_SITE_OTHER): Payer: Medicaid Other | Admitting: Emergency Medicine

## 2012-07-05 VITALS — BP 105/66 | Wt 127.0 lb

## 2012-07-05 DIAGNOSIS — Z3403 Encounter for supervision of normal first pregnancy, third trimester: Secondary | ICD-10-CM

## 2012-07-05 DIAGNOSIS — Z3400 Encounter for supervision of normal first pregnancy, unspecified trimester: Secondary | ICD-10-CM

## 2012-07-05 NOTE — Progress Notes (Signed)
S: 22 yo G1P0 at [redacted]w[redacted]d by LMP.  No concerns today.  Does have trouble sleeping due to fetal movement and bladder pressure.  No contractions.  No loss of fluid, no vaginal bleeding or discharge.   Peds: [facility] Birth control: mirena Feeding: breast  O: see flowsheet  A/P: 22 yo G1P0 at [redacted]w[redacted]d by LMP - continue prenatal vitamin - GBS negative - reviewed labor precautions - follow up in 1 week

## 2012-07-05 NOTE — Patient Instructions (Signed)
If you have regular contractions that are 3-5 minutes apart for at least one hour, bleeding or fluid from your vagina, or you do not feel the baby moving enough, please go to the [facility].

## 2012-07-08 ENCOUNTER — Encounter (HOSPITAL_COMMUNITY): Payer: Self-pay | Admitting: *Deleted

## 2012-07-08 ENCOUNTER — Inpatient Hospital Stay (HOSPITAL_COMMUNITY)
Admission: AD | Admit: 2012-07-08 | Discharge: 2012-07-10 | DRG: 775 | Disposition: A | Discharge status: Home / Self Care | Payer: Medicaid Other | Source: Ambulatory Visit | Attending: Obstetrics & Gynecology | Admitting: Obstetrics & Gynecology

## 2012-07-08 ENCOUNTER — Encounter (HOSPITAL_COMMUNITY): Payer: Self-pay | Admitting: Anesthesiology

## 2012-07-08 ENCOUNTER — Inpatient Hospital Stay (HOSPITAL_COMMUNITY): Payer: Medicaid Other | Admitting: Anesthesiology

## 2012-07-08 DIAGNOSIS — O328XX0 Maternal care for other malpresentation of fetus, not applicable or unspecified: Secondary | ICD-10-CM | POA: Diagnosis present

## 2012-07-08 DIAGNOSIS — IMO0001 Reserved for inherently not codable concepts without codable children: Secondary | ICD-10-CM

## 2012-07-08 HISTORY — DX: Other specified health status: Z78.9

## 2012-07-08 LAB — CBC
HCT: 36.6 % (ref 36.0–46.0)
Hemoglobin: 12.6 g/dL (ref 12.0–15.0)
MCH: 25.7 pg — ABNORMAL LOW (ref 26.0–34.0)
MCHC: 34.4 g/dL (ref 30.0–36.0)
RBC: 4.91 MIL/uL (ref 3.87–5.11)

## 2012-07-08 LAB — TYPE AND SCREEN: Antibody Screen: NEGATIVE

## 2012-07-08 MED ORDER — IBUPROFEN 600 MG PO TABS
600.0000 mg | ORAL_TABLET | Freq: Four times a day (QID) | ORAL | Status: DC
  Administered 2012-07-08 – 2012-07-10 (×7): 600 mg via ORAL
  Filled 2012-07-08 (×8): qty 1

## 2012-07-08 MED ORDER — PRENATAL MULTIVITAMIN CH
1.0000 | ORAL_TABLET | Freq: Every day | ORAL | Status: DC
  Administered 2012-07-09: 1 via ORAL
  Filled 2012-07-08: qty 1

## 2012-07-08 MED ORDER — DIBUCAINE 1 % RE OINT
1.0000 "application " | TOPICAL_OINTMENT | RECTAL | Status: DC | PRN
  Filled 2012-07-08: qty 28

## 2012-07-08 MED ORDER — TETANUS-DIPHTH-ACELL PERTUSSIS 5-2.5-18.5 LF-MCG/0.5 IM SUSP
0.5000 mL | Freq: Once | INTRAMUSCULAR | Status: DC
  Filled 2012-07-08: qty 0.5

## 2012-07-08 MED ORDER — ONDANSETRON HCL 4 MG/2ML IJ SOLN
4.0000 mg | Freq: Four times a day (QID) | INTRAMUSCULAR | Status: DC | PRN
  Administered 2012-07-08: 4 mg via INTRAVENOUS
  Filled 2012-07-08: qty 2

## 2012-07-08 MED ORDER — WITCH HAZEL-GLYCERIN EX PADS
1.0000 "application " | MEDICATED_PAD | CUTANEOUS | Status: DC | PRN
  Administered 2012-07-09: 1 via TOPICAL

## 2012-07-08 MED ORDER — PHENYLEPHRINE 40 MCG/ML (10ML) SYRINGE FOR IV PUSH (FOR BLOOD PRESSURE SUPPORT)
80.0000 ug | PREFILLED_SYRINGE | INTRAVENOUS | Status: DC | PRN
  Filled 2012-07-08: qty 2

## 2012-07-08 MED ORDER — LANOLIN HYDROUS EX OINT: TOPICAL_OINTMENT | CUTANEOUS | Status: DC | PRN

## 2012-07-08 MED ORDER — FENTANYL 2.5 MCG/ML BUPIVACAINE 1/10 % EPIDURAL INFUSION (WH - ANES)
INTRAMUSCULAR | Status: DC | PRN
  Administered 2012-07-08: 12 mL/h via EPIDURAL

## 2012-07-08 MED ORDER — SODIUM CHLORIDE 0.9 % IJ SOLN: 3.0000 mL | INTRAMUSCULAR | Status: DC | PRN

## 2012-07-08 MED ORDER — CITRIC ACID-SODIUM CITRATE 334-500 MG/5ML PO SOLN: 30.0000 mL | ORAL | Status: DC | PRN

## 2012-07-08 MED ORDER — OXYTOCIN 40 UNITS IN LACTATED RINGERS INFUSION - SIMPLE MED
62.5000 mL/h | INTRAVENOUS | Status: DC
  Administered 2012-07-08: 62.5 mL/h via INTRAVENOUS
  Filled 2012-07-08: qty 1000

## 2012-07-08 MED ORDER — FLEET ENEMA 7-19 GM/118ML RE ENEM: 1.0000 | ENEMA | Freq: Every day | RECTAL | Status: DC | PRN

## 2012-07-08 MED ORDER — EPHEDRINE 5 MG/ML INJ
10.0000 mg | INTRAVENOUS | Status: DC | PRN
  Filled 2012-07-08: qty 2

## 2012-07-08 MED ORDER — MEASLES, MUMPS & RUBELLA VAC ~~LOC~~ INJ
0.5000 mL | INJECTION | Freq: Once | SUBCUTANEOUS | Status: DC
  Filled 2012-07-08: qty 0.5

## 2012-07-08 MED ORDER — LACTATED RINGERS IV SOLN
500.0000 mL | INTRAVENOUS | Status: DC | PRN
  Administered 2012-07-08: 1000 mL via INTRAVENOUS

## 2012-07-08 MED ORDER — EPHEDRINE 5 MG/ML INJ
10.0000 mg | INTRAVENOUS | Status: DC | PRN
  Filled 2012-07-08: qty 4
  Filled 2012-07-08: qty 2

## 2012-07-08 MED ORDER — DIPHENHYDRAMINE HCL 25 MG PO CAPS: 25.0000 mg | ORAL_CAPSULE | Freq: Four times a day (QID) | ORAL | Status: DC | PRN

## 2012-07-08 MED ORDER — FLEET ENEMA 7-19 GM/118ML RE ENEM: 1.0000 | ENEMA | RECTAL | Status: DC | PRN

## 2012-07-08 MED ORDER — OXYCODONE-ACETAMINOPHEN 5-325 MG PO TABS: 1.0000 | ORAL_TABLET | ORAL | Status: DC | PRN

## 2012-07-08 MED ORDER — ONDANSETRON HCL 4 MG PO TABS: 4.0000 mg | ORAL_TABLET | ORAL | Status: DC | PRN

## 2012-07-08 MED ORDER — LIDOCAINE HCL (PF) 1 % IJ SOLN
30.0000 mL | INTRAMUSCULAR | Status: DC | PRN
  Filled 2012-07-08 (×2): qty 30

## 2012-07-08 MED ORDER — OXYCODONE-ACETAMINOPHEN 5-325 MG PO TABS
1.0000 | ORAL_TABLET | ORAL | Status: DC | PRN
  Administered 2012-07-09 – 2012-07-10 (×3): 1 via ORAL
  Filled 2012-07-08 (×3): qty 1

## 2012-07-08 MED ORDER — SIMETHICONE 80 MG PO CHEW: 80.0000 mg | CHEWABLE_TABLET | ORAL | Status: DC | PRN

## 2012-07-08 MED ORDER — LACTATED RINGERS IV SOLN
INTRAVENOUS | Status: DC
  Administered 2012-07-08: 10:00:00 via INTRAVENOUS

## 2012-07-08 MED ORDER — BISACODYL 10 MG RE SUPP
10.0000 mg | Freq: Every day | RECTAL | Status: DC | PRN
  Filled 2012-07-08: qty 1

## 2012-07-08 MED ORDER — DIPHENHYDRAMINE HCL 50 MG/ML IJ SOLN: 12.5000 mg | INTRAMUSCULAR | Status: DC | PRN

## 2012-07-08 MED ORDER — ZOLPIDEM TARTRATE 5 MG PO TABS: 5.0000 mg | ORAL_TABLET | Freq: Every evening | ORAL | Status: DC | PRN

## 2012-07-08 MED ORDER — LIDOCAINE HCL (PF) 1 % IJ SOLN
INTRAMUSCULAR | Status: DC | PRN
  Administered 2012-07-08 (×2): 3 mL

## 2012-07-08 MED ORDER — OXYTOCIN BOLUS FROM INFUSION: 500.0000 mL | INTRAVENOUS | Status: DC

## 2012-07-08 MED ORDER — FENTANYL 2.5 MCG/ML BUPIVACAINE 1/10 % EPIDURAL INFUSION (WH - ANES)
14.0000 mL/h | INTRAMUSCULAR | Status: DC | PRN
  Administered 2012-07-08: 14 mL/h via EPIDURAL
  Filled 2012-07-08 (×2): qty 125

## 2012-07-08 MED ORDER — LACTATED RINGERS IV SOLN
500.0000 mL | Freq: Once | INTRAVENOUS | Status: AC
  Administered 2012-07-08: 500 mL via INTRAVENOUS

## 2012-07-08 MED ORDER — IBUPROFEN 600 MG PO TABS: 600.0000 mg | ORAL_TABLET | Freq: Four times a day (QID) | ORAL | Status: DC | PRN

## 2012-07-08 MED ORDER — SODIUM CHLORIDE 0.9 % IJ SOLN: 3.0000 mL | Freq: Two times a day (BID) | INTRAMUSCULAR | Status: DC

## 2012-07-08 MED ORDER — FENTANYL CITRATE 0.05 MG/ML IJ SOLN
50.0000 ug | INTRAMUSCULAR | Status: DC | PRN
  Administered 2012-07-08: 50 ug via INTRAVENOUS
  Filled 2012-07-08: qty 2

## 2012-07-08 MED ORDER — SENNOSIDES-DOCUSATE SODIUM 8.6-50 MG PO TABS
2.0000 | ORAL_TABLET | Freq: Every day | ORAL | Status: DC
  Administered 2012-07-08 – 2012-07-09 (×2): 2 via ORAL

## 2012-07-08 MED ORDER — ONDANSETRON HCL 4 MG/2ML IJ SOLN: 4.0000 mg | INTRAMUSCULAR | Status: DC | PRN

## 2012-07-08 MED ORDER — OXYTOCIN 40 UNITS IN LACTATED RINGERS INFUSION - SIMPLE MED: 62.5000 mL/h | INTRAVENOUS | Status: DC | PRN

## 2012-07-08 MED ORDER — ACETAMINOPHEN 325 MG PO TABS
650.0000 mg | ORAL_TABLET | ORAL | Status: DC | PRN
Start: 2012-07-08 — End: 2012-07-08

## 2012-07-08 MED ORDER — PHENYLEPHRINE 40 MCG/ML (10ML) SYRINGE FOR IV PUSH (FOR BLOOD PRESSURE SUPPORT)
80.0000 ug | PREFILLED_SYRINGE | INTRAVENOUS | Status: DC | PRN
  Filled 2012-07-08: qty 5
  Filled 2012-07-08: qty 2

## 2012-07-08 MED ORDER — SODIUM CHLORIDE 0.9 % IV SOLN: 250.0000 mL | INTRAVENOUS | Status: DC | PRN

## 2012-07-08 MED ORDER — BENZOCAINE-MENTHOL 20-0.5 % EX AERO
1.0000 "application " | INHALATION_SPRAY | CUTANEOUS | Status: DC | PRN
  Administered 2012-07-09: 1 via TOPICAL
  Filled 2012-07-08 (×2): qty 56

## 2012-07-08 NOTE — Progress Notes (Signed)
Amber Hubbard is a 22 y.o. G1P0 at [redacted]w[redacted]d by admitted for active labor  Subjective: Mostly comfortable w/ epidural, feeling some pain w/ uc's in upper abdomen, not feeling any pressure  Objective: BP 110/71  Pulse 73  Temp(Src) 98 F (36.7 C) (Oral)  Resp 18  Ht 4\' 11"  (1.499 m)  Wt 58.06 kg (128 lb)  BMI 25.84 kg/m2  SpO2 100%  LMP 10/09/2011   Total I/O In: -  Out: 250 [Urine:250]  FHT:  FHR: 140 bpm, variability: moderate,  accelerations:  Present,  decelerations:  Present earlies UC:   regular, every 2-3 minutes SVE:  10/100/+2 to +3 Labs: Lab Results  Component Value Date   WBC 11.0* 07/08/2012   HGB 12.6 07/08/2012   HCT 36.6 07/08/2012   MCV 74.5* 07/08/2012   PLT 192 07/08/2012    Assessment / Plan: Spontaneous labor, progressing normally, to begin pushing  Labor: Progressing normally Preeclampsia:  no s/s Fetal Wellbeing:  Category I Pain Control:  Epidural I/D:  n/a Anticipated MOD:  NSVD  Marge Duncans 07/08/2012, 1:36 PM

## 2012-07-08 NOTE — Anesthesia Preprocedure Evaluation (Signed)
Anesthesia Evaluation  Patient identified by MRN, date of birth, ID band Patient awake    Reviewed: Allergy & Precautions, H&P , NPO status , Patient's Chart, lab work & pertinent test results  Airway Mallampati: III TM Distance: >3 FB Neck ROM: Full    Dental   Pulmonary neg pulmonary ROS,  breath sounds clear to auscultation        Cardiovascular negative cardio ROS  Rhythm:Regular Rate:Normal     Neuro/Psych negative neurological ROS  negative psych ROS   GI/Hepatic negative GI ROS, Neg liver ROS,   Endo/Other  negative endocrine ROS  Renal/GU negative Renal ROS     Musculoskeletal negative musculoskeletal ROS (+)   Abdominal   Peds  Hematology negative hematology ROS (+)   Anesthesia Other Findings   Reproductive/Obstetrics (+) Pregnancy                           Anesthesia Physical Anesthesia Plan  ASA: II  Anesthesia Plan: Epidural   Post-op Pain Management:    Induction:   Airway Management Planned:   Additional Equipment:   Intra-op Plan:   Post-operative Plan:   Informed Consent: I have reviewed the patients History and Physical, chart, labs and discussed the procedure including the risks, benefits and alternatives for the proposed anesthesia with the patient or authorized representative who has indicated his/her understanding and acceptance.     Plan Discussed with:   Anesthesia Plan Comments:         Anesthesia Quick Evaluation

## 2012-07-08 NOTE — Progress Notes (Signed)
Artelia Laroche CNM notified of pt's admission and status. Aware of ctx pattern, sve, pt report of pink vag bleeding but none on glove. Admit to BS.

## 2012-07-08 NOTE — Progress Notes (Signed)
Report called to Dana RN in BS.  

## 2012-07-08 NOTE — Anesthesia Procedure Notes (Signed)
Epidural Patient location during procedure: OB Start time: 07/08/2012 5:20 AM End time: 07/08/2012 5:35 AM  Staffing Anesthesiologist: Lewie Loron R Performed by: anesthesiologist   Preanesthetic Checklist Completed: patient identified, pre-op evaluation, timeout performed, IV checked, risks and benefits discussed and monitors and equipment checked  Epidural Patient position: sitting Prep: site prepped and draped and DuraPrep Patient monitoring: blood pressure, heart rate and continuous pulse ox Approach: midline Injection technique: LOR air and LOR saline  Needle:  Needle type: Tuohy  Needle gauge: 17 G Needle length: 9 cm Needle insertion depth: 5 cm Catheter type: closed end flexible Catheter size: 19 Gauge Catheter at skin depth: 11 cm  Assessment Sensory level: T8 Events: blood not aspirated, injection not painful, no injection resistance, negative IV test and no paresthesia  Additional Notes Reason for block:procedure for pain

## 2012-07-08 NOTE — Progress Notes (Signed)
To 169 via w./c 

## 2012-07-08 NOTE — MAU Note (Signed)
Contractions since 0230. Some pink vag bleeding.

## 2012-07-08 NOTE — Progress Notes (Signed)
[name] is a 22 y.o. G1P0 at [redacted]w[redacted]d admitted for active labor  Subjective: Comfortable w/ epidural, no complaints  Objective: BP 120/78  Pulse 68  Temp(Src) 97.9 F (36.6 C) (Oral)  Resp 20  Ht 4\' 11"  (1.499 m)  Wt 58.06 kg (128 lb)  BMI 25.84 kg/m2  SpO2 100%  LMP 10/09/2011      FHT:  FHR: 135 bpm, variability: moderate,  accelerations:  Present,  decelerations:  Absent UC:   regular, every 2-3 minutes SVE:  6.5/90/+1 bbow AROM mod amt light MSF  Labs: Lab Results  Component Value Date   WBC 11.0* 07/08/2012   HGB 12.6 07/08/2012   HCT 36.6 07/08/2012   MCV 74.5* 07/08/2012   PLT 192 07/08/2012    Assessment / Plan: Spontaneous labor, progressing normally , now arom'd  Labor: Progressing normally Preeclampsia:  n/a Fetal Wellbeing:  Category I Pain Control:  Epidural I/D:  n/a Anticipated MOD:  NSVD  Marge Duncans 07/08/2012, 9:37 AM

## 2012-07-08 NOTE — H&P (Signed)
Amber Hubbard is a 22 y.o. female presenting for Labor.  Maternal Medical History:  Reason for admission: Contractions.  Nausea.  Contractions: Onset was 3-5 hours ago.   Frequency: regular.   Perceived severity is strong.    Fetal activity: Perceived fetal activity is normal.   Last perceived fetal movement was within the past hour.    Prenatal complications: Bleeding (pink discharge at home).   Prenatal Complications - Diabetes: none.    OB History   Grav Para Term Preterm Abortions TAB SAB Ect Mult Living   1              Past Medical History  Diagnosis Date  . Medical history non-contributory    Past Surgical History  Procedure Laterality Date  . Tonsillectomy     Family History: family history includes Hypertension in her father and mother. Social History:  reports that she has never smoked. She has never used smokeless tobacco. She reports that she does not drink alcohol or use illicit drugs.   Prenatal Transfer Tool  Maternal Diabetes: No Genetic Screening: Normal Maternal Ultrasounds/Referrals: Normal Fetal Ultrasounds or other Referrals:  None Maternal Substance Abuse:  No Significant Maternal Medications:  None Significant Maternal Lab Results:  None Other Comments:  None  Review of Systems  Constitutional: Negative for fever, chills and malaise/fatigue.  Gastrointestinal: Positive for abdominal pain. Negative for nausea, vomiting, diarrhea and constipation.  Genitourinary: Negative for dysuria.  Neurological: Negative for dizziness and headaches.    Dilation: 3.5 Effacement (%): 100 Station: -1 Exam by:: Quintella Baton RNC Blood pressure 127/86, pulse 82, temperature 98.5 F (36.9 C), resp. rate 24, height 4\' 11"  (1.499 m), weight 58.151 kg (128 lb 3.2 oz), last menstrual period 10/09/2011. Maternal Exam:  Uterine Assessment: Contraction strength is firm.  Contraction frequency is regular.   Abdomen: Fundal height is 39.   Fetal presentation:  vertex  Introitus: Normal vulva. Normal vagina.  Vagina is negative for discharge.  Ferning test: not done.  Nitrazine test: not done. Amniotic fluid character: not assessed.  Pelvis: adequate for delivery.   Cervix: Cervix evaluated by digital exam.     Fetal Exam Fetal Monitor Review: Mode: ultrasound.   Baseline rate: 140.  Variability: moderate (6-25 bpm).   Pattern: accelerations present and no decelerations.    Fetal State Assessment: Category I - tracings are normal.     Physical Exam  Constitutional: She is oriented to person, place, and time. She appears well-developed and well-nourished. No distress.  HENT:  Head: Normocephalic.  Cardiovascular: Normal rate.   Respiratory: Effort normal.  GI: Soft. She exhibits no distension. There is no tenderness. There is no rebound and no guarding.  Genitourinary: Vagina normal and uterus normal. No vaginal discharge found.  Musculoskeletal: Normal range of motion.  Neurological: She is alert and oriented to person, place, and time.  Skin: Skin is warm and dry.  Psychiatric: She has a normal mood and affect.    Prenatal labs: ABO, Rh: A/POS/-- (01/31 0865) Antibody: NEG (01/31 0838) Rubella: 0.94 (01/31 0838) RPR: NON REAC (04/10 0948)  HBsAg: NEGATIVE (01/31 0838)  HIV: NON REACTIVE (04/10 0948)  GBS: NEGATIVE (06/16 1103)   Assessment/Plan: A:  SIUP at [redacted]w[redacted]d        Active Labor (cervix was closed and is not 3+)  P:  Admit to [facility]      Dr Elwyn Reach paged      Wants epidural   [facility] 07/08/2012, 4:06 AM

## 2012-07-09 LAB — RPR: RPR Ser Ql: NONREACTIVE

## 2012-07-09 NOTE — H&P (Signed)
Attestation of Attending Supervision of Advanced Practitioner (CNM/NP): Evaluation and management procedures were performed by the Advanced Practitioner under my supervision and collaboration. I have reviewed the Advanced Practitioner's note and chart, and I agree with the management and plan.  Sharron Simpson H. 11:30 AM   

## 2012-07-09 NOTE — Progress Notes (Signed)
Post Partum Day 1 Subjective: Eating, drinking, voiding, ambulating well.  +flatus.  Lochia and pain wnl.  Denies dizziness, lightheadedness, or sob. No complaints.  Declines early d/c  Objective: Blood pressure 95/55, pulse 104, temperature 98.9 F (37.2 C), temperature source Oral, resp. rate 18, height 4\' 11"  (1.499 m), weight 58.06 kg (128 lb), last menstrual period 10/09/2011, SpO2 93.00%, unknown if currently breastfeeding.  Physical Exam:  General: alert, cooperative and no distress Lochia: appropriate Uterine Fundus: firm Incision: no s/s DVT Evaluation: No evidence of DVT seen on physical exam. Negative Homan's sign. No cords or calf tenderness. No significant calf/ankle edema.   Recent Labs  07/08/12 0440  HGB 12.6  HCT 36.6    Assessment/Plan: Plan for discharge tomorrow, Breastfeeding, Lactation consult and Contraception mirena   LOS: 1 day   Marge Duncans 07/09/2012, 8:04 AM

## 2012-07-10 MED ORDER — IBUPROFEN 600 MG PO TABS: 600.0000 mg | ORAL_TABLET | Freq: Four times a day (QID) | ORAL | Status: DC

## 2012-07-10 NOTE — Progress Notes (Signed)
Post ur discharge review completed.  

## 2012-07-10 NOTE — Discharge Summary (Signed)
Obstetric Discharge Summary Reason for Admission: onset of labor Prenatal Procedures: none Intrapartum Procedures: spontaneous vaginal delivery Postpartum Procedures: none Complications-Operative and Postpartum: none Hemoglobin  Date Value Range Status  07/08/2012 12.6  12.0 - 15.0 g/dL Final     HCT  Date Value Range Status  07/08/2012 36.6  36.0 - 46.0 % Final    Physical Exam:  BP 117/76  Pulse 70  Temp(Src) 98.1 F (36.7 C) (Oral)  Resp 19  Ht 4\' 11"  (1.499 m)  Wt 58.06 kg (128 lb)  BMI 25.84 kg/m2  SpO2 97%  LMP 10/09/2011  General: alert, cooperative and no distress Lochia: appropriate Uterine Fundus: firm DVT Evaluation: No evidence of DVT seen on physical exam. Cardiovascular:  RRR Lungs:  CTAB   Discharge Diagnoses: Term Pregnancy-delivered  Discharge Information: Date: 07/10/2012 Activity: pelvic rest Diet: routine Medications: PNV and Ibuprofen Condition: stable Instructions: refer to practice specific booklet Follow up with provider as directed  Discharge to: home   Newborn Data: Live born female  Birth Weight: 6 lb 5.1 oz (2865 g) APGAR: 9, 9  Home with mother.  Chrissie Noa 07/10/2012, 7:29 AM

## 2012-07-10 NOTE — Progress Notes (Signed)
Instructed patient several times throughout this shift that pacifier should not be used when infant wants to nurse. Mother is attempting to put infant on 2 hour schedule and continues to hold pacifier in mouth during times she does not nurse. Cluster feeding teaching was initiated at start of 7 PM shift and continued to reinforce information during the night. Infant crying and very fussy throughout shift and emotional support was provided.

## 2012-07-19 ENCOUNTER — Encounter: Payer: Medicaid Other | Admitting: Emergency Medicine

## 2012-07-23 NOTE — Discharge Summary (Signed)
i saw this pt and agree with note.

## 2012-08-15 ENCOUNTER — Ambulatory Visit (INDEPENDENT_AMBULATORY_CARE_PROVIDER_SITE_OTHER): Payer: Medicaid Other | Admitting: Family Medicine

## 2012-08-15 ENCOUNTER — Encounter: Payer: Self-pay | Admitting: Family Medicine

## 2012-08-15 VITALS — BP 106/72 | HR 114 | Temp 98.5°F | Wt 107.9 lb

## 2012-08-15 DIAGNOSIS — R059 Cough, unspecified: Secondary | ICD-10-CM

## 2012-08-15 DIAGNOSIS — R05 Cough: Secondary | ICD-10-CM

## 2012-08-15 DIAGNOSIS — J029 Acute pharyngitis, unspecified: Secondary | ICD-10-CM

## 2012-08-15 MED ORDER — PRENATAL MULTIVITAMIN CH: 1.0000 | ORAL_TABLET | Freq: Every day | ORAL | Status: DC

## 2012-08-15 NOTE — Assessment & Plan Note (Signed)
22 year old female with cough likely secondary to viral illness -Conservative therapy with cough drops, saltwater gargle, Tylenol as needed for fevers

## 2012-08-15 NOTE — Assessment & Plan Note (Signed)
22 year old female with pharyngitis secondary to viral infection -Conservative therapy with cough drops, salt water gargles, Tylenol as needed for pain

## 2012-08-15 NOTE — Patient Instructions (Signed)
Viral Infections A virus is a type of germ. Viruses can cause:  Minor sore throats.  Aches and pains.  Headaches.  Runny nose.  Rashes.  Watery eyes.  Tiredness.  Coughs.  Loss of appetite.  Feeling sick to your stomach (nausea).  Throwing up (vomiting).  Watery poop (diarrhea). HOME CARE   Only take medicines as told by your doctor.  Drink enough water and fluids to keep your pee (urine) clear or pale yellow. Sports drinks are a good choice.  Get plenty of rest and eat healthy. Soups and broths with crackers or rice are fine. GET HELP RIGHT AWAY IF:   You have a very bad headache.  You have shortness of breath.  You have chest pain or neck pain.  You have an unusual rash.  You cannot stop throwing up.  You have watery poop that does not stop.  You cannot keep fluids down.  You or your child has a temperature by mouth above 102 F (38.9 C), not controlled by medicine.  Your baby is older than 3 months with a rectal temperature of 102 F (38.9 C) or higher.  Your baby is 71 months old or younger with a rectal temperature of 100.4 F (38 C) or higher. MAKE SURE YOU:   Understand these instructions.  Will watch this condition.  Will get help right away if you are not doing well or get worse. Document Released: 12/04/2007 Document Revised: 03/15/2011 Document Reviewed: 04/28/2010 [facility] Patient Information 2014 [place], [place].  May take Tylenol for headache and fevers. May also continue to use cough drops/salt water gargles.

## 2012-08-15 NOTE — Progress Notes (Signed)
  Subjective:    Patient ID: Amber Hubbard, female    DOB: April 07, 1990, 22 y.o.   MRN: [id number]  HPI 22 year old female presents with four-day history of nonproductive cough, she has associated sore throat, denies fevers or chills, mild abdominal pain with coughing, her sister was recently sick with similar illness, no nausea, no vomiting, no diarrhea, she has been attempting saltwater gargles and cough drops which provided mild relief, she is currently lactating as she gave birth in July 2014, she would like to know which medications are safe in pregnancy   Review of Systems  Constitutional: Negative for fever and fatigue.  HENT: Positive for congestion, sore throat, rhinorrhea and postnasal drip. Negative for hearing loss, ear pain, mouth sores, neck pain and ear discharge.   Eyes: Positive for discharge and itching. Negative for pain and redness.  Respiratory: Positive for cough. Negative for wheezing and stridor.        Objective:   Physical Exam Vitals: Reviewed HEENT: Pupils equal round reactive to light, extraocular movements were intact, mild clear discharge from bilateral eyes, no scleral icterus, no conjunctival injection, slight rhinorrhea, nasal septum midline, moist mucous membranes, uvula midline, bilateral tonsils present, postnasal drip and mild pharyngeal erythema, no exudate, neck was supple, no anterior or posterior cervical lymphadenopathy Cardiac: Regular rate and rhythm, S1 and S2 present, no murmurs gallops or rubs Respiratory: Clear to auscultation bilaterally, cough with deep inspiration, good effort Abdomen soft, bowel sounds present in all 4 quadrants, mild diffuse tenderness sign Extremities: No edema       Assessment & Plan:

## 2012-08-16 ENCOUNTER — Ambulatory Visit (INDEPENDENT_AMBULATORY_CARE_PROVIDER_SITE_OTHER): Payer: Medicaid Other | Admitting: Emergency Medicine

## 2012-08-16 ENCOUNTER — Encounter: Payer: Self-pay | Admitting: Emergency Medicine

## 2012-08-16 DIAGNOSIS — Z309 Encounter for contraceptive management, unspecified: Secondary | ICD-10-CM

## 2012-08-16 LAB — POCT URINE PREGNANCY: Preg Test, Ur: NEGATIVE

## 2012-08-16 IMAGING — US US PELVIS COMPLETE
1 series · 13 of 25 positions shown · non-contrast
Comparison: None.

CLINICAL DATA: Bilateral pelvic pain, greater on the right.



[Series 1: us pelvis complete · 0.27mm/px · 13 of 73 slices shown]
[im 1/73]
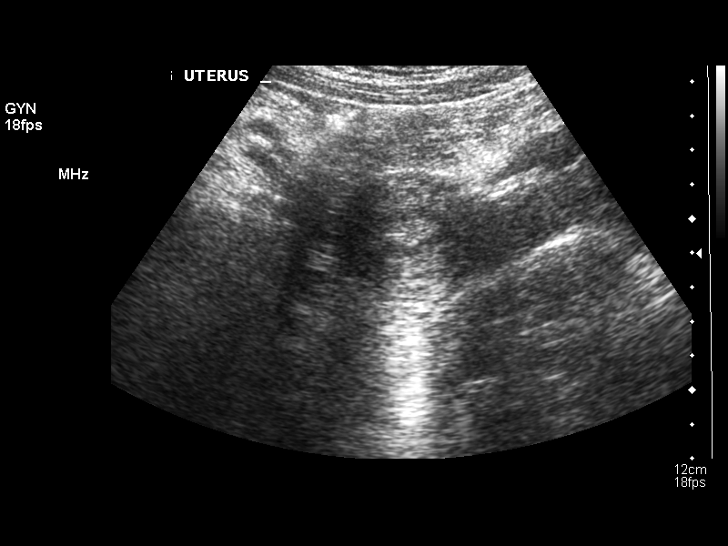
[im 7/73]
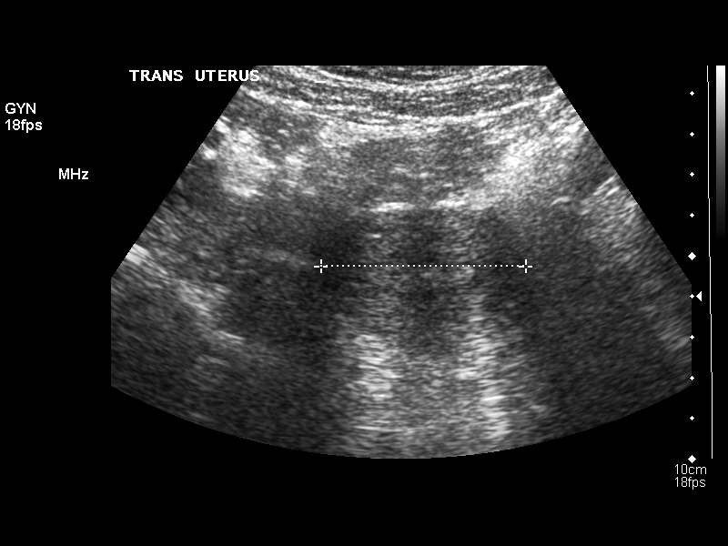
[im 13/73]
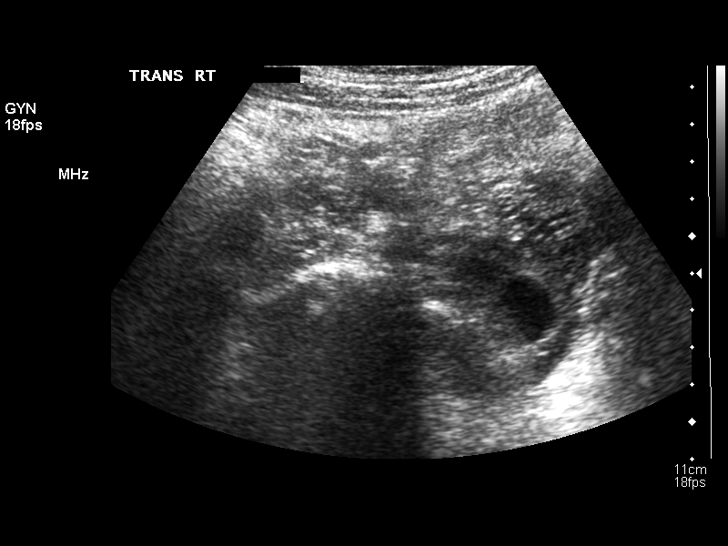
[im 19/73]
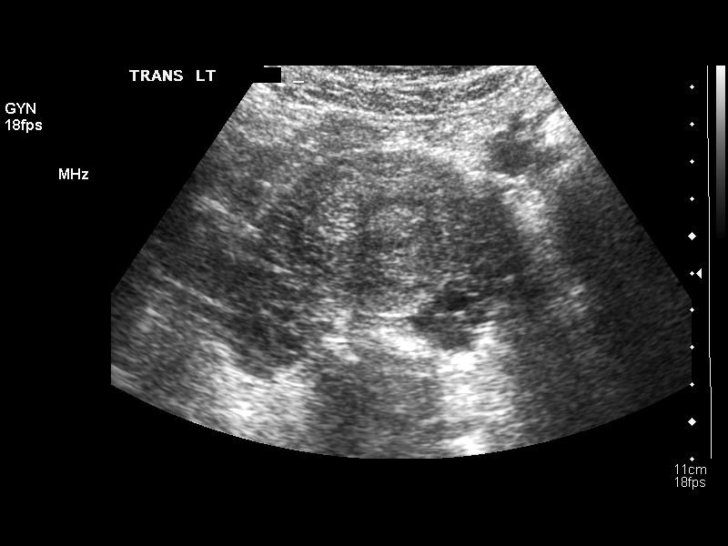
[im 25/73]
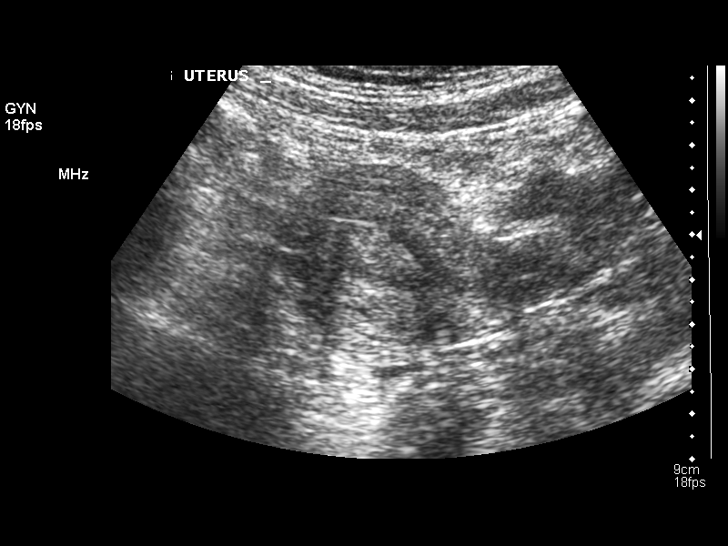
[im 31/73]
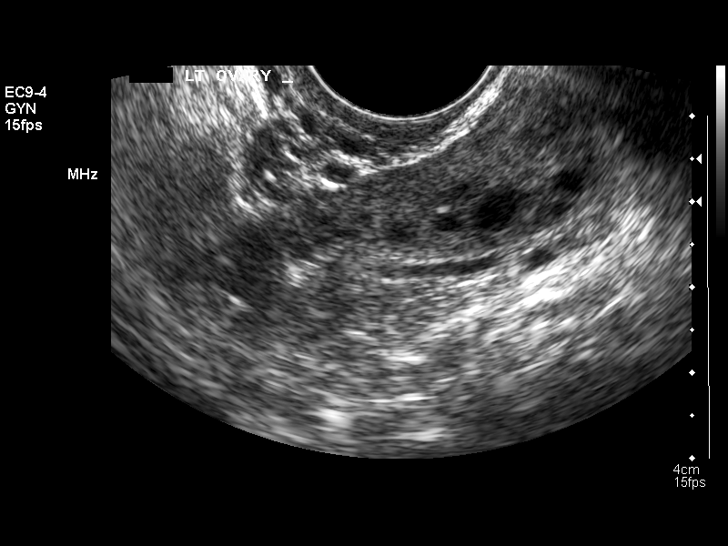
[im 37/73]
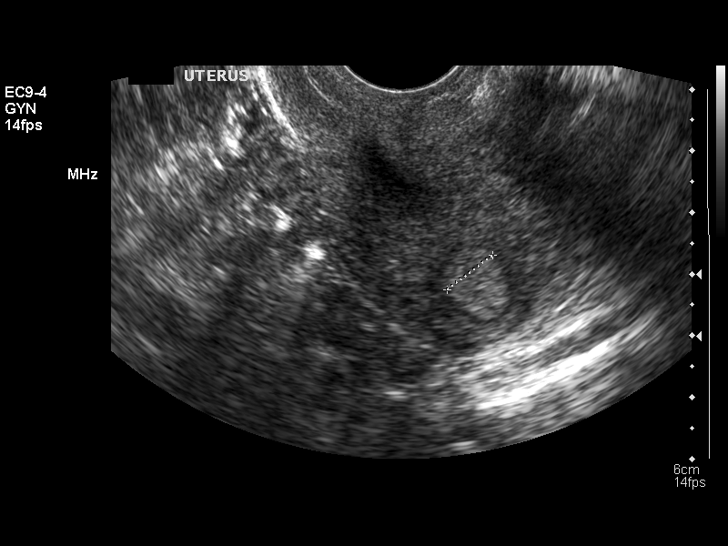
[im 43/73]
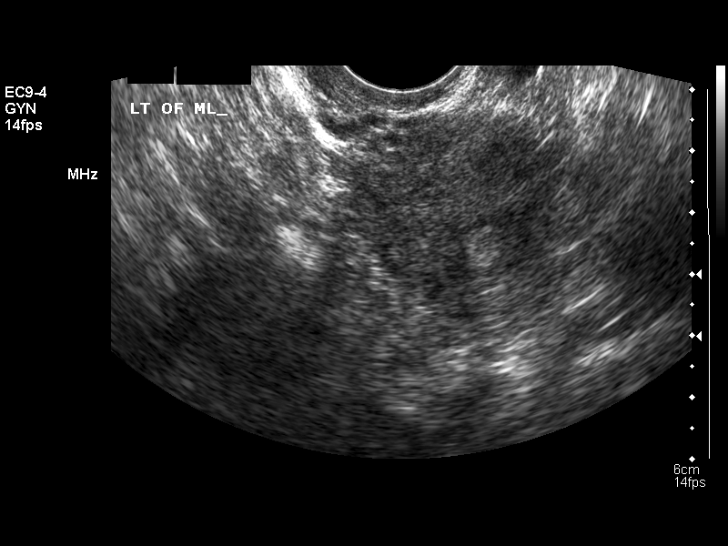
[im 49/73]
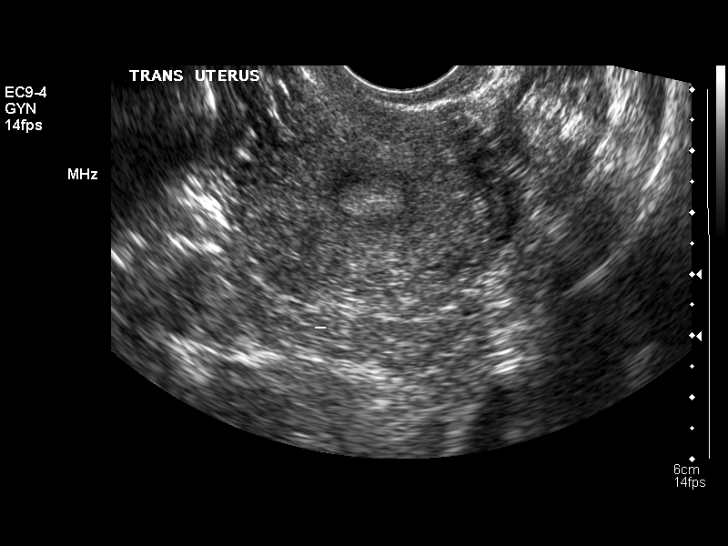
[im 55/73]
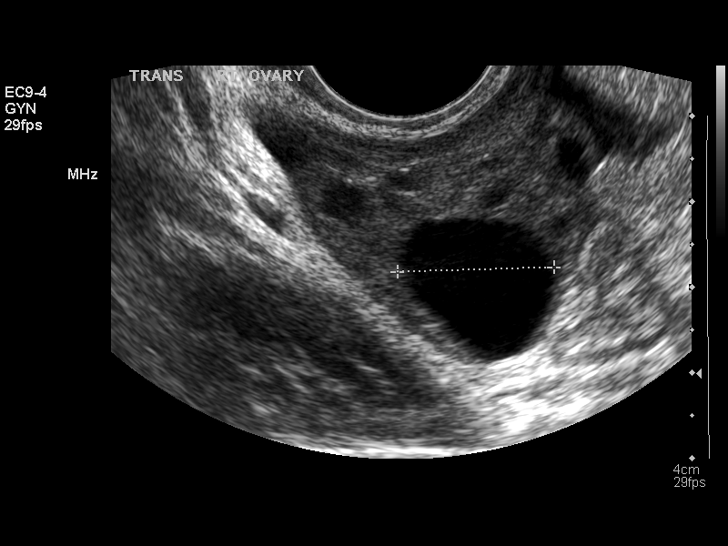
[im 61/73]
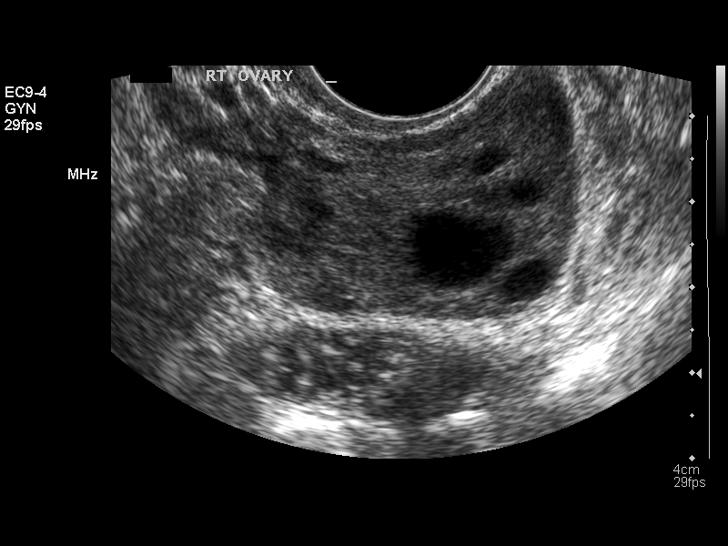
[im 67/73]
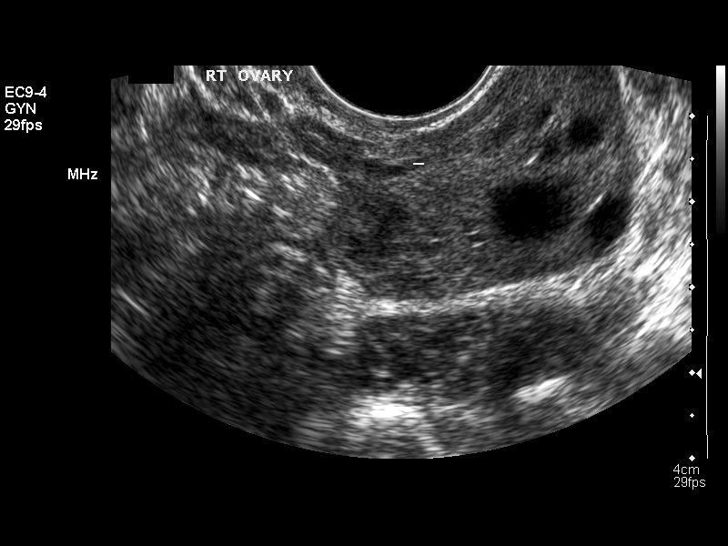
[im 73/73]
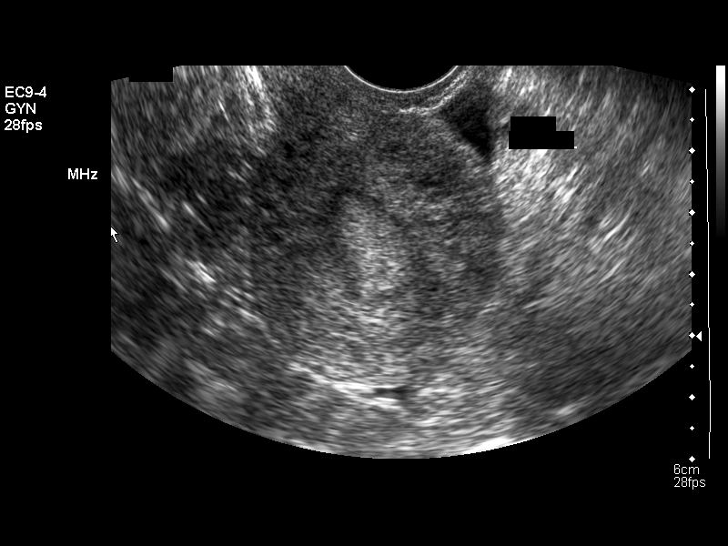

[13 of 25 positions shown; findings below may reference images not displayed]

FINDINGS: Uterus normal, measuring 6.4 x 4.4 x 3.0 cm in maximum dimensions.

Endometrium normal, measuring 9.3 mm in thickness transvaginally.

Right Ovary 2.4 x 1.5 x 1.4 cm oval, mildly heterogeneous solid
appearing area in the right ovary with prominent surrounding blood
flow and no internal blood flow with color Doppler.  Otherwise,
normal, containing follicles and measuring 3.8 x 3.7 x 3.1 cm in
maximum dimensions.

Left Ovary normal, containing follicles and measuring 3.4 x 2.5 x
1.5 cm in maximum dimensions.

Other Findings:  Trace amount of free peritoneal fluid, within
normal limits of physiological fluid.
IMPRESSION: 1.  2.4 cm probable recently collapsed right ovarian cyst.  An
endometrioma could have a similar appearance.  A solid neoplasm is
unlikely.  A follow-up pelvic ultrasound is recommended in 8 weeks
to assess for resolution.
2.  Otherwise, normal examination.

## 2012-08-16 NOTE — Assessment & Plan Note (Signed)
IUD placed today. Reviewed expected course with patient.

## 2012-08-16 NOTE — Patient Instructions (Addendum)
It was nice to see you!  We put on IUD in today.  You did great!  This is good for 5 years, but we can take it out earlier if you decide to have another baby.  You will likely have some irregular spotting for the next 3-6 months; this typically improves with time.  Take advil or aleve every 8 hours today and tomorrow to help with cramping.  This should improve in the next day or two.  Check the strings every month.    If you cannot feel the strings, please make an appointment to see me so we can confirm the placement of the IUD.  Follow up as needed.  Intrauterine Device Insertion Care After Refer to this sheet in the next few weeks. These instructions provide you with information on caring for yourself after your procedure. Your caregiver may also give you more specific instructions. Your treatment has been planned according to current medical practices, but problems sometimes occur. Call your caregiver if you have any problems or questions after your procedure. HOME CARE INSTRUCTIONS   Only take over-the-counter or prescription medicines for pain, discomfort, or fever as directed by your caregiver. Do not use aspirin. This may increase bleeding.  Check your IUD to make sure it is in place before you resume sexual activity. You should be able to feel the strings. If you cannot feel the strings, something may be wrong. The IUD may have fallen out of the uterus, or the uterus may have been punctured (perforated) during placement. Also, if the strings are getting longer, it may mean that the IUD is being forced out of the uterus. You no longer have full protection from pregnancy if any of these problems occur.  You may resume sexual intercourse if you are not having problems with the IUD. The IUD is considered immediately effective.  You may resume normal activities.  Keep all follow-up appointments to be sure your IUD has remained in place. After the first exam, yearly exams are advised,  unless you cannot feel the strings of your IUD.  Continue to check that the IUD is still in place by feeling for the strings after every menstrual period. SEEK MEDICAL CARE IF:   You have bleeding that is heavier or lasts longer than a normal menstrual cycle.  You have a fever.  You have increasing cramps or abdominal pain not relieved with medicine.  You have abdominal pain that does not seem to be related to the same area of earlier cramping and pain.  You are lightheaded, unusually weak, or faint.  You have abnormal vaginal discharge or smells.  You have pain during sexual intercourse.  You cannot feel the IUD strings, or the IUD string has gotten longer.  You feel the IUD at the opening of the cervix in the vagina.  You think you are pregnant, or you miss your menstrual period.  The IUD string is hurting your sex partner. Document Released: 08/19/2010 Document Revised: 03/15/2011 Document Reviewed: 08/19/2010 [facility] Patient Information 2014 [place], [place].

## 2012-08-16 NOTE — Progress Notes (Signed)
  Subjective:    Patient ID: Amber Hubbard, female    DOB: Jan 17, 1990, 22 y.o.   MRN: [id number]  HPI Amber Hubbard is here for postpartum f/u.  Postpartum Visit Patient is here for a postpartum visit. She is 6 weeks postpartum following a spontaneous vaginal delivery. I have fully reviewed the prenatal and intrapartum course. The delivery was at [redacted]w[redacted]d gestational weeks. Outcome: spontaneous vaginal delivery. Anesthesia: epidural.  Postpartum course has been uncomplicated. Baby's course has been uncomplicated. Baby is feeding by breast. Bleeding no bleeding. Bowel function is normal. Bladder function is normal. Patient is not sexually active. Contraception method is none and wants mirena. Postpartum depression screening: negative.   I have reviewed and updated the following as appropriate: allergies, current medications and problem list SHx: never smoker   Review of Systems See HPI    Objective:   Physical Exam BP 104/73  Pulse 90  Ht 4\' 11"  (1.499 m)  Wt 105 lb (47.628 kg)  BMI 21.2 kg/m2 Gen: alert, cooperative, NAD Pelvic: normal external genitalia, normal vagina, no discharge or bleeding; normal gravid cervix; anteflexed uterus on bimanual exam      Assessment & Plan:  IUD INSERTION: Patient given informed consent, signed copy in the chart..  Negative pregnancy confirmed.  Appropriate time out taken.   Sterile instruments and technique was used. Cervix brought into view with use of speculum and then cleansed three times with  betadine swabs.  A tenaculum was placed into the anterior lip of the cervix and a uterine sound was used to measure uterine size.   A Mirena IUD was placed into the endometrial cavity, deployed and secured. The applicator was removed. The strings were trimmed to 3 centimeters.   There were no complications and the patient tolerated the procedure well.   She was given handouts for post procedure instructions and information about the IUD including a card with  the time of recommended removal.

## 2012-08-16 NOTE — Assessment & Plan Note (Signed)
Doing well. PHQ-9 screen negative.

## 2012-08-21 MED ORDER — LEVONORGESTREL 20 MCG/24HR IU IUD
INTRAUTERINE_SYSTEM | Freq: Once | INTRAUTERINE | Status: AC
  Administered 2012-08-21: 09:00:00 via INTRAUTERINE

## 2012-08-21 NOTE — Addendum Note (Signed)
Addended by: Jone Baseman D on: 08/21/2012 09:07 AM   Modules accepted: Orders

## 2012-10-20 ENCOUNTER — Ambulatory Visit (INDEPENDENT_AMBULATORY_CARE_PROVIDER_SITE_OTHER): Payer: Medicaid Other | Admitting: Emergency Medicine

## 2012-10-20 ENCOUNTER — Encounter: Payer: Self-pay | Admitting: Emergency Medicine

## 2012-10-20 VITALS — BP 115/75 | HR 70 | Temp 98.1°F | Ht 59.0 in | Wt 101.0 lb

## 2012-10-20 DIAGNOSIS — K602 Anal fissure, unspecified: Secondary | ICD-10-CM | POA: Insufficient documentation

## 2012-10-20 MED ORDER — POLYETHYLENE GLYCOL 3350 17 GM/SCOOP PO POWD: 17.0000 g | Freq: Two times a day (BID) | ORAL | Status: DC | PRN

## 2012-10-20 NOTE — Patient Instructions (Signed)
Anal Fissure, Adult  An anal fissure is a small tear or crack in the skin around the opening of the butt (anus). Bleeding from the tear or crack usually stops on its own within a few minutes. The bleeding may happen every time you poop until the tear or crack heals.   HOME CARE  · Eat lots of fruit, whole grains, and vegetables. Avoid foods like bananas and dairy products. These foods can make it hard to poop.  · Take a warm water bath (sitz bath) as told by your doctor.  · Drink enough fluids to keep your pee (urine) clear or pale yellow.  · Only take medicines as told by your doctor. Do not take aspirin.  · Do not use numbing creams or hydrocortisone cream on the area. These creams can slow healing.  GET HELP RIGHT AWAY IF:  · Your tear or crack is not healed in 3 days.  · You have more bleeding.  · You have a fever.  · You have watery poop (diarrhea) mixed with blood.  · You have pain.  · You are getting worse, not better.  MAKE SURE YOU:   · Understand these instructions.  · Will watch your condition.  · Will get help right away if you are not doing well or get worse.  Document Released: 08/19/2010 Document Revised: 03/15/2011 Document Reviewed: 08/19/2010  [employer]® Patient Information ©2014 [employer].

## 2012-10-20 NOTE — Progress Notes (Signed)
  Subjective:    Patient ID: Amber Hubbard, female    DOB: Jun 06, 1990, 22 y.o.   MRN: [id number]  HPI Amber Hubbard is here for a SDA for rectal bleeding.  She reports seeing blood on the stool and in the toilet bowl for the last week.  She describes hard stools and straining for the last few weeks.  When she first noticed the blood, bowel movements were quite painful.  The pain has since resolved, but she will still see a streak of blood on the stool and have a few drops of blood in the toilet bowel with BMs.  Specifically denies vaginal bleeding or pain.  No dizziness or chest pain.  She has not taken anything for this.  I have reviewed and updated the following as appropriate: allergies and current medications SHx: never smoker   Review of Systems See HPI    Objective:   Physical Exam BP 115/75  Pulse 70  Temp(Src) 98.1 F (36.7 C) (Oral)  Ht 4\' 11"  (1.499 m)  Wt 101 lb (45.813 kg)  BMI 20.39 kg/m2  Breastfeeding? Yes Gen: alert, cooperative, NAD Rectal: external exam notable for a healing tear at 12 o'clock; no external hemorrhoids seen; no internal hemorrhoids appreciated  FOBT +      Assessment & Plan:

## 2012-10-20 NOTE — Assessment & Plan Note (Signed)
History and exam consist with anal fissure. No external or internal hemorrhoids appreciated Looks to be healing. Will give miralax.  Discussed titration to soft BMs daily. Follow up if still have bleeding after 2 weeks of soft BMs.

## 2013-08-16 ENCOUNTER — Ambulatory Visit (INDEPENDENT_AMBULATORY_CARE_PROVIDER_SITE_OTHER): Payer: Medicaid Other | Admitting: Family Medicine

## 2013-08-16 ENCOUNTER — Encounter: Payer: Self-pay | Admitting: Family Medicine

## 2013-08-16 ENCOUNTER — Other Ambulatory Visit (HOSPITAL_COMMUNITY)
Admission: RE | Admit: 2013-08-16 | Discharge: 2013-08-16 | Disposition: A | Discharge status: Home / Self Care | Payer: Medicaid Other | Source: Ambulatory Visit | Attending: Family Medicine | Admitting: Family Medicine

## 2013-08-16 VITALS — BP 103/64 | HR 76 | Temp 98.4°F | Ht 59.0 in | Wt 106.5 lb

## 2013-08-16 DIAGNOSIS — N898 Other specified noninflammatory disorders of vagina: Secondary | ICD-10-CM

## 2013-08-16 DIAGNOSIS — N939 Abnormal uterine and vaginal bleeding, unspecified: Secondary | ICD-10-CM

## 2013-08-16 DIAGNOSIS — Z30432 Encounter for removal of intrauterine contraceptive device: Secondary | ICD-10-CM

## 2013-08-16 DIAGNOSIS — Z124 Encounter for screening for malignant neoplasm of cervix: Secondary | ICD-10-CM | POA: Diagnosis present

## 2013-08-16 DIAGNOSIS — Z113 Encounter for screening for infections with a predominantly sexual mode of transmission: Secondary | ICD-10-CM | POA: Diagnosis not present

## 2013-08-16 DIAGNOSIS — Z1151 Encounter for screening for human papillomavirus (HPV): Secondary | ICD-10-CM | POA: Insufficient documentation

## 2013-08-16 LAB — POCT WET PREP (WET MOUNT): Clue Cells Wet Prep Whiff POC: NEGATIVE

## 2013-08-16 LAB — POCT URINE PREGNANCY: PREG TEST UR: NEGATIVE

## 2013-08-16 MED ORDER — PRENATAL VITAMINS 28-0.8 MG PO TABS: 1.0000 | ORAL_TABLET | Freq: Every day | ORAL | Status: DC

## 2013-08-16 NOTE — Patient Instructions (Signed)
Take the prenatal vitamins every day. I will call you if your test results are not normal.  Otherwise, I will send you a letter.  If you do not hear from me with in 2 weeks please call our office.      Preparing for Pregnancy Before trying to become pregnant, make an appointment with your health care provider (preconception care). The goal is to help you have a healthy, safe pregnancy. At your first appointment, your health care provider will:   Do a complete physical exam, including a Pap test.  Take a complete medical history.  Give you advice and help you resolve any problems. PRECONCEPTION CHECKLIST Here is a list of the basics to cover with your health care provider at your preconception visit:  Medical history.  Tell your health care provider about any diseases you have had. Many diseases can affect your pregnancy.  Include your partner's medical history and family history.  Make sure you have been tested for sexually transmitted infections (STIs). These can affect your pregnancy. In some cases, they can be passed to your baby. Tell your health care provider about any history of STIs.  Make sure your health care provider knows about any previous problems you have had with conception or pregnancy.  Tell your health care provider about any medicine you take. This includes herbal supplements and over-the-counter medicines.  Make sure all your immunizations are up to date. You may need to make additional appointments.  Ask your health care provider if you need any vaccinations or if there are any you should avoid.  Diet.  It is especially important to eat a healthy, balanced diet with the right nutrients when you are pregnant.  Ask your health care provider to help you get to a healthy weight before pregnancy.  If you are overweight, you are at higher risk for certain complications. These include high blood pressure, diabetes, and preterm birth.  If you are underweight, you  are more likely to have a low-birth-weight baby.  Lifestyle.  Tell your health care provider about lifestyle factors such as alcohol use, drug use, or smoking.  Describe any harmful substances you may be exposed to at work or home. These can include chemicals, pesticides, and radiation.  Mental health.  Let your health care provider know if you have been feeling depressed or anxious.  Let your health care provider know if you have a history of substance abuse.  Let your health care provider know if you do not feel safe at home. HOME INSTRUCTIONS TO PREPARE FOR PREGNANCY Follow your health care provider's advice and instructions.   Keep an accurate record of your menstrual periods. This makes it easier for your health care provider to determine your baby's due date.  Begin taking prenatal vitamins and folic acid supplements daily. Take them as directed by your health care provider.  Eat a balanced diet. Get help from a nutrition counselor if you have questions or need help.  Get regular exercise. Try to be active for at least 30 minutes a day most days of the week.  Quit smoking, if you smoke.  Do not drink alcohol.  Do not take illegal drugs.  Get medical problems, such as diabetes or high blood pressure, under control.  If you have diabetes, make sure you do the following:  Have good blood sugar control. If you have type 1 diabetes, use multiple daily doses of insulin. Do not use split-dose or premixed insulin.  Have an eye exam  by a qualified eye care professional trained in caring for people with diabetes.  Get evaluated by your health care provider for cardiovascular disease.  Get to a healthy weight. If you are overweight or obese, reduce your weight with the help of a qualified health professional such as a Museum/gallery exhibitions officer. Ask your health care provider what the right weight range is for you. HOW DO I KNOW I AM PREGNANT? You may be pregnant if you have been  sexually active and you miss your period. Symptoms of early pregnancy include:   Mild cramping.  Very light vaginal bleeding (spotting).  Feeling unusually tired.  Morning sickness. If you have any of these symptoms, take a home pregnancy test. These tests look for a hormone called human chorionic gonadotropin (hCG) in your urine. Your body begins to make this hormone during early pregnancy. These tests are very accurate. Wait until at least the first day you miss your period to take one. If you get a positive result, call your health care provider to make appointments for prenatal care. WHAT SHOULD I DO IF I BECOME PREGNANT?  Make an appointment with your health care provider by week 12 of your pregnancy at the latest.  Do not smoke. Smoking can be harmful to your baby.  Do not drink alcoholic beverages. Alcohol is related to a number of birth defects.  Avoid toxic odors and chemicals.  You may continue to have sexual intercourse if it does not cause pain or other problems, such as vaginal bleeding. Document Released: 12/04/2007 Document Revised: 05/07/2013 Document Reviewed: 11/27/2012 [facility] Patient Information 2015 [place], [place]. This information is not intended to replace advice given to you by your health care provider. Make sure you discuss any questions you have with your health care provider.

## 2013-08-16 NOTE — Progress Notes (Signed)
Patient ID: Amber Hubbard, female   DOB: 1990/05/06, 23 y.o.   MRN: [id number]  HPI:  Pt wants mirena IUD removed today. Has had this for one year. Has one child who is one year old, now wants to become pregnant with another child. States she has been cramping & bleeding for 3 weeks. Has also had vaginal discharge. Prior to now had just occasional spotting with mirena IUD. Sexually active with only her husband in the last year.  ROS: See HPI.  PMFSH: hx PTSD, lactose intolerance  PHYSICAL EXAM: BP 103/64  Pulse 76  Temp(Src) 98.4 F (36.9 C) (Oral)  Ht 4\' 11"  (1.499 m)  Wt 106 lb 8 oz (48.308 kg)  BMI 21.50 kg/m2 Gen: NAD HEENT: NCAT Neuro: speech normal, grossly nonfocal GU: normal appearing external genitalia without lesions. Vagina is moist with white discharge. Cervix normal in appearance. IUD strings visualized, IUD removed easily with ring forceps without complication. No cervical motion tenderness or significant tenderness on bimanual exam. No adnexal masses.   ASSESSMENT/PLAN:  Health maintenance:  -pap smear done today -checking STD screen today due to cramping & vaginal discharge - check GC/chlamydia & wet prep -rx prenatal vitamin, given handout on pre-pregnancy care. -urine pregnancy test negative, IUD removed today  FOLLOW UP: F/u as needed.  GrenadaBrittany J. Pollie MeyerMcIntyre, MD [facility]

## 2013-08-20 LAB — CYTOLOGY - PAP

## 2013-08-24 ENCOUNTER — Telehealth: Payer: Self-pay | Admitting: Family Medicine

## 2013-08-24 NOTE — Telephone Encounter (Signed)
Pt informed of results. Blount, Deseree CMA 

## 2013-08-24 NOTE — Telephone Encounter (Signed)
[facility] red team,please call pt and let her know:  - her wet prep showed yeast, so I am sending in diflucan for her to take if she would like to be treated. This is NOT a sexually transmitted infection. She can take one pill today and repeat in 3 days if her discharge has not improved. - her pap smear can be repeated in 3 years. - her other tests for infections were negative  Thanks,  Latrelle DodrillBrittany J Courtlyn Aki, MD

## 2013-09-05 ENCOUNTER — Ambulatory Visit (INDEPENDENT_AMBULATORY_CARE_PROVIDER_SITE_OTHER): Payer: Medicaid Other | Admitting: Family Medicine

## 2013-09-05 ENCOUNTER — Encounter: Payer: Self-pay | Admitting: Family Medicine

## 2013-09-05 VITALS — BP 102/63 | HR 70 | Temp 98.1°F | Ht 59.0 in | Wt 105.8 lb

## 2013-09-05 DIAGNOSIS — B3731 Acute candidiasis of vulva and vagina: Secondary | ICD-10-CM

## 2013-09-05 DIAGNOSIS — B373 Candidiasis of vulva and vagina: Secondary | ICD-10-CM

## 2013-09-05 MED ORDER — FLUCONAZOLE 150 MG PO TABS: 150.0000 mg | ORAL_TABLET | Freq: Once | ORAL | Status: DC

## 2013-09-05 NOTE — Progress Notes (Signed)
Patient ID: Amber Hubbard, female   DOB: 02-28-1990, 23 y.o.   MRN: [id number]  HPI:  Pt presents to f/u on dx with yeast infection (diagnosed during pelvic exam and wet prep last visit). Still has some itching and discharge. Never got rx for diflucan, states pharmacy didn't receive it. She is about to move to [place] soon to live with her husband. No other complaints today.  ROS: See HPI.  PMFSH: hx lactose intolerance, PTSD  PHYSICAL EXAM: BP 102/63  Pulse 70  Temp(Src) 98.1 F (36.7 C) (Oral)  Ht  (1.499 m)  Wt 105 lb 12.8 oz (47.991 kg)  BMI 21.36 kg/m2 Gen: NAD HEENT: NCAT Lungs: NWOB Neuro: grossly nonfocal, speech normal  ASSESSMENT/PLAN:  Vaginal candidiasis: -rx sent in for diflucan  x1, repeat in 3 days if not improving.  FOLLOW UP: F/u as needed if symptoms worsen or do not improve.   [place]ada J. Pollie Meyer, MD [facility]

## 2013-09-05 NOTE — Patient Instructions (Signed)
Take the diflucan. I sent in to your pharmacy and printed it off for you as well.  Call if you have any problems. Good luck in your move to [place]!  Be well, Dr. Pollie Meyer   Candidal Vulvovaginitis Candidal vulvovaginitis is an infection of the vagina and vulva. The vulva is the skin around the opening of the vagina. This may cause itching and discomfort in and around the vagina.  HOME CARE  Only take medicine as told by your doctor.  Do not have sex (intercourse) until the infection is healed or as told by your doctor.  Practice safe sex.  Tell your sex partner about your infection.  Do not douche or use tampons.  Wear cotton underwear. Do not wear tight pants or panty hose.  Eat yogurt. This may help treat and prevent yeast infections. GET HELP RIGHT AWAY IF:   You have a fever.  Your problems get worse during treatment or do not get better in 3 days.  You have discomfort, irritation, or itching in your vagina or vulva area.  You have pain after sex.  You start to get belly (abdominal) pain. MAKE SURE YOU:  Understand these instructions.  Will watch your condition.  Will get help right away if you are not doing well or get worse. Document Released: 03/19/2008 Document Revised: 12/26/2012 Document Reviewed: 03/19/2008 [facility] Patient Information 2015 [place], [place]. This information is not intended to replace advice given to you by your health care provider. Make sure you discuss any questions you have with your health care provider.

## 2013-11-05 ENCOUNTER — Encounter: Payer: Self-pay | Admitting: Family Medicine

## 2013-12-25 ENCOUNTER — Ambulatory Visit (INDEPENDENT_AMBULATORY_CARE_PROVIDER_SITE_OTHER): Payer: Self-pay | Admitting: Family Medicine

## 2013-12-25 ENCOUNTER — Encounter: Payer: Self-pay | Admitting: Family Medicine

## 2013-12-25 VITALS — BP 114/67 | HR 79 | Temp 98.5°F | Ht 59.0 in | Wt 107.1 lb

## 2013-12-25 DIAGNOSIS — H6120 Impacted cerumen, unspecified ear: Secondary | ICD-10-CM | POA: Insufficient documentation

## 2013-12-25 DIAGNOSIS — H6122 Impacted cerumen, left ear: Secondary | ICD-10-CM

## 2013-12-25 MED ORDER — CARBAMIDE PEROXIDE 6.5 % OT SOLN: 5.0000 [drp] | Freq: Two times a day (BID) | OTIC | Status: DC

## 2013-12-25 NOTE — Progress Notes (Signed)
   Subjective: Amber Hubbard is a 23 y.o. female presenting for left ear fullness.  She reports a recent history of runny nose and cough without fever that lasted about 2 days and dissipated 2 days ago which was followed yesterday by worsening left ear fullness. She has had this feeling for several weeks but much worse lately. No pain, drainage, or history of foreign bodies, but does endorse decreased hearing. Denies dizziness, HA, vision changes. Non-smoker.   - Review of Systems: Per HPI.   Objective: BP 114/67 mmHg  Pulse 79  Temp(Src) 98.5 F (36.9 C) (Oral)  Ht 4\' 11"  (1.499 m)  Wt 107 lb 1.6 oz (48.58 kg)  BMI 21.62 kg/m2  LMP 12/05/2013 Gen:  23 y.o. female in no distress HEENT: Normocephalic, sclerae clear, conjunctivae normal, pupils equal and reactive, R TM normal with slight yellow-brown cerumen in canal, L ear canal completely obstructed by brown cerumen. Nares normal, moist mucous membranes, posterior oropharynx clear, good dentition  * Following irrigation, left ear exam reveals clear canal and pearly grey translucent TM with normal landmarks.   Assessment/Plan: Amber Hubbard is a [age] y.o. female here for cerumen impaction of the left ear canal.  Cerumen impaction - Primary  treated successfully today with irrigation.

## 2013-12-25 NOTE — Patient Instructions (Signed)
Your ear looks much better now. For the future you can use a few things to treat heavy earwax.   These are all over the counter, so see which works for you: - Hydrogen peroxide - Mineral oil - Debrox solution  Place some in your ear and tilt your head to maintain it in that ear for 15 minutes twice daily. Do not use these for more than a few days at a time. If you're still having symptoms you should come to the clinic to be looked at.   Happy Holidays! - Dr. Jarvis NewcomerGrunz

## 2013-12-25 NOTE — Assessment & Plan Note (Signed)
treated successfully today with irrigation.

## 2014-03-12 ENCOUNTER — Encounter (HOSPITAL_COMMUNITY): Payer: Self-pay | Admitting: Emergency Medicine

## 2014-03-12 ENCOUNTER — Emergency Department (HOSPITAL_COMMUNITY)
Admission: EM | Admit: 2014-03-12 | Discharge: 2014-03-13 | Discharge status: Against Medical Advice | Payer: Medicaid Other | Attending: Emergency Medicine | Admitting: Emergency Medicine

## 2014-03-12 DIAGNOSIS — H578 Other specified disorders of eye and adnexa: Secondary | ICD-10-CM | POA: Insufficient documentation

## 2014-03-12 NOTE — ED Notes (Signed)
Pt presents with left eye irritation for the past hour, denies injury, denies changes in vision.

## 2014-04-29 ENCOUNTER — Encounter: Payer: Self-pay | Admitting: Family Medicine

## 2014-04-29 ENCOUNTER — Ambulatory Visit (INDEPENDENT_AMBULATORY_CARE_PROVIDER_SITE_OTHER): Payer: Medicaid Other | Admitting: Family Medicine

## 2014-04-29 VITALS — BP 102/68 | HR 76 | Temp 98.1°F | Ht 59.0 in | Wt 104.0 lb

## 2014-04-29 DIAGNOSIS — F32A Depression, unspecified: Secondary | ICD-10-CM

## 2014-04-29 DIAGNOSIS — F329 Major depressive disorder, single episode, unspecified: Secondary | ICD-10-CM

## 2014-04-29 MED ORDER — SERTRALINE HCL 50 MG PO TABS: 50.0000 mg | ORAL_TABLET | Freq: Every day | ORAL | Status: DC
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
## 2014-04-29 NOTE — Patient Instructions (Signed)
Zoloft 50 mg: once daily  May 6th: take 1 in morning, 1/2 at night or you may take 1.5 pills at once  => 75mg  daily  May 20th: take 2 tabs daily.  May take once in morning and once in evening or two tabs at once  =>100mg  daily

## 2014-05-31 ENCOUNTER — Ambulatory Visit: Payer: Medicaid Other | Admitting: Family Medicine

## 2014-06-20 ENCOUNTER — Encounter: Payer: Self-pay | Admitting: Family Medicine

## 2014-06-20 ENCOUNTER — Ambulatory Visit (INDEPENDENT_AMBULATORY_CARE_PROVIDER_SITE_OTHER): Payer: Medicaid Other | Admitting: Family Medicine

## 2014-06-20 VITALS — BP 112/68 | HR 77 | Temp 98.6°F | Ht 59.0 in | Wt 102.2 lb

## 2014-06-20 DIAGNOSIS — F329 Major depressive disorder, single episode, unspecified: Secondary | ICD-10-CM

## 2014-06-20 DIAGNOSIS — F32A Depression, unspecified: Secondary | ICD-10-CM

## 2014-06-20 MED ORDER — SERTRALINE HCL 50 MG PO TABS: 100.0000 mg | ORAL_TABLET | Freq: Every day | ORAL | Status: DC

## 2014-06-20 NOTE — Patient Instructions (Signed)
Try increasing to 2 pills at night Call if you feel worse with this or can't tolerate it and we can try something different Follow up in 2 months If you have any thoughts of hurting yourself or anyone else, go to the Emergency Room to stay safe.   Be well, Dr. Pollie Meyer

## 2014-06-23 DIAGNOSIS — F32A Depression, unspecified: Secondary | ICD-10-CM | POA: Insufficient documentation

## 2014-06-23 DIAGNOSIS — F329 Major depressive disorder, single episode, unspecified: Secondary | ICD-10-CM | POA: Insufficient documentation

## 2014-06-23 NOTE — Assessment & Plan Note (Signed)
Some better, but not as much as desired with zoloft 50mg  daily Encouraged pt to try increasing to 75mg  or 100mg  daily. Dose at night due to reported sedation with starting this med back in April. Pt has moved to [place] and has had trouble finding new PCP there, so continues to come to our clinic. Advised that if she finds she cannot tolerate increased doses of zoloft, to call our clinic and we can try another med rx which I can provide over the phone, and she can f/u in person several weeks after that. Pt appreciative and agreeable to this plan. Reiterated importance of going to ER if has thoughts of harming self or others.

## 2014-06-23 NOTE — Progress Notes (Signed)
Patient ID: Amber Hubbard, female   DOB: [birth year], 24 y.o.   MRN: [id number]  HPI:  F/U depression: currently taking zoloft 50mg  daily. Was started on this during visit on 04/29/14 with Dr. Loreta Ave at [facility]. Denies any SI/HI. Thinks she feels a little bit better, but not as well as she wants. 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 Those symptoms have resolved. Previously took zoloft years back and did well on this.   ROS: See HPI.  PMFSH: hx dysmenorrhea, depression  PHYSICAL EXAM: BP 112/68 mmHg  Pulse 77  Temp(Src) 98.6 F (37 C) (Oral)  Ht 4\' 11"  (1.499 m)  Wt 102 lb 3.2 oz (46.358 kg)  BMI 20.63 kg/m2 Gen: NAD HEENT: NCAT Lungs: NWOB Psych: normal range of affect, well groomed, speech normal in rate and volume, normal eye contact   ASSESSMENT/PLAN:  Depression Some better, but not as much as desired with zoloft 50mg  daily Encouraged pt to try increasing to 75mg  or 100mg  daily. Dose at night due to reported sedation with starting this med back in April. Pt has moved to [place] and has had trouble finding new PCP there, so continues to come to our clinic. Advised that if she finds she cannot tolerate increased doses of zoloft, to call our clinic and we can try another med rx which I can provide over the phone, and she can f/u in person several weeks after that. Pt appreciative and agreeable to this plan. Reiterated importance of going to ER if has thoughts of harming self or others.   FOLLOW UP: F/u in 2 mos for depression, sooner by phone if unable to tolerate increased doses of zoloft  Gren[place] J. Pollie Meyer, MD [facility]

## 2014-06-25 ENCOUNTER — Ambulatory Visit (INDEPENDENT_AMBULATORY_CARE_PROVIDER_SITE_OTHER): Payer: Medicaid Other | Admitting: *Deleted

## 2014-06-25 DIAGNOSIS — Z111 Encounter for screening for respiratory tuberculosis: Secondary | ICD-10-CM | POA: Diagnosis present

## 2014-06-28 ENCOUNTER — Ambulatory Visit (INDEPENDENT_AMBULATORY_CARE_PROVIDER_SITE_OTHER): Payer: Medicaid Other | Admitting: *Deleted

## 2014-06-28 DIAGNOSIS — Z111 Encounter for screening for respiratory tuberculosis: Secondary | ICD-10-CM | POA: Diagnosis present

## 2014-06-28 LAB — TB SKIN TEST: INDURATION: 0 mm

## 2014-06-28 NOTE — Progress Notes (Signed)
   Pt in nurse clinic for PPD Reading of left arm.  Area erythema measuring 7 with no induration.  Verbal order given by Dr. Randolm Idol to repeat PPD skin test. PPD given Rt forearm.  Advised patient not put any pressure, scratch or place anything on top of PPD.  [facility] was called to check if patient had blood test or PPD placed when she came to the [place] as a refugee back in 2002.  No records were noted from health department.  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  Pt to return on Monday 07/01/14 at 8:30 AM for reading.  Clovis Pu, RN

## 2014-07-01 ENCOUNTER — Ambulatory Visit: Payer: Medicaid Other | Admitting: *Deleted

## 2014-07-01 ENCOUNTER — Telehealth: Payer: Self-pay | Admitting: Family Medicine

## 2014-07-01 ENCOUNTER — Other Ambulatory Visit: Payer: Self-pay | Admitting: Family Medicine

## 2014-07-01 ENCOUNTER — Ambulatory Visit
Admission: RE | Admit: 2014-07-01 | Discharge: 2014-07-01 | Disposition: A | Discharge status: Home / Self Care | Payer: Medicaid Other | Source: Ambulatory Visit | Attending: Family Medicine | Admitting: Family Medicine

## 2014-07-01 DIAGNOSIS — Z227 Latent tuberculosis: Secondary | ICD-10-CM

## 2014-07-01 DIAGNOSIS — Z111 Encounter for screening for respiratory tuberculosis: Secondary | ICD-10-CM

## 2014-07-01 DIAGNOSIS — R7611 Nonspecific reaction to tuberculin skin test without active tuberculosis: Secondary | ICD-10-CM

## 2014-07-01 LAB — TB SKIN TEST
Induration: 10 mm
TB Skin Test: POSITIVE

## 2014-07-01 NOTE — Progress Notes (Signed)
Pt returns today for 2nd PPD read.  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 Randolm IdolFletke and Dr. Lum BabeEniola.  All questions answered by Dr. Randolm IdolFletke.  Pt will go for xray today and followup appt on 07/09/14 with Dr. Randolm IdolFletke.  Chandni Gagan, Maryjo RochesterJessica Dawn, CMA

## 2014-07-01 NOTE — Telephone Encounter (Signed)
Discussed negative chest xray with patient. Explained difference between latent TB and active TB. 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

## 2014-07-01 NOTE — Progress Notes (Signed)
Pt informed of negative xray by Dr. Randolm IdolFletke.  Faxed demographics, ppd placement and results, adn chest xray to Health Department @ [phone].  [facility] Fleeger, Maryjo RochesterJessica Dawn

## 2014-07-01 NOTE — Assessment & Plan Note (Signed)
PPD positive to 10 mm, CXR negative, no active signs/symptoms of TB -referred to health department for further management.

## 2014-07-09 ENCOUNTER — Ambulatory Visit: Payer: Medicaid Other | Admitting: Family Medicine

## 2014-07-12 NOTE — Telephone Encounter (Signed)
[facility].  They contacted pt but she is in [place] and is going to [place] [facility].  See fax in scanned media. Kaity Pitstick, Maryjo RochesterJessica Dawn

## 2014-07-15 ENCOUNTER — Telehealth: Payer: Self-pay | Admitting: Family Medicine

## 2014-07-15 NOTE — Telephone Encounter (Signed)
Notes faxed to number provided.

## 2014-07-15 NOTE — Telephone Encounter (Signed)
[place] the Health Dept [place] needs a copy of her results from her last PPD. Please fax this to [phone]. Myriam Jacobsonjw

## 2014-07-23 ENCOUNTER — Ambulatory Visit (INDEPENDENT_AMBULATORY_CARE_PROVIDER_SITE_OTHER): Payer: Medicaid Other | Admitting: Family Medicine

## 2014-07-23 ENCOUNTER — Encounter: Payer: Self-pay | Admitting: Family Medicine

## 2014-07-23 ENCOUNTER — Ambulatory Visit: Payer: Medicaid Other | Admitting: Family Medicine

## 2014-07-23 VITALS — BP 106/84 | Temp 98.4°F | Resp 86 | Wt 102.5 lb

## 2014-07-23 DIAGNOSIS — R05 Cough: Secondary | ICD-10-CM | POA: Diagnosis present

## 2014-07-23 DIAGNOSIS — B9789 Other viral agents as the cause of diseases classified elsewhere: Secondary | ICD-10-CM

## 2014-07-23 DIAGNOSIS — N912 Amenorrhea, unspecified: Secondary | ICD-10-CM | POA: Diagnosis not present

## 2014-07-23 DIAGNOSIS — R059 Cough, unspecified: Secondary | ICD-10-CM

## 2014-07-23 DIAGNOSIS — R1031 Right lower quadrant pain: Secondary | ICD-10-CM

## 2014-07-23 DIAGNOSIS — J069 Acute upper respiratory infection, unspecified: Secondary | ICD-10-CM

## 2014-07-23 LAB — POCT URINE PREGNANCY: Preg Test, Ur: NEGATIVE

## 2014-07-23 NOTE — Progress Notes (Signed)
Subjective:     Patient ID: Lacie ScottsUyen E Simic, female   DOB: Jun 03, 1990, 24 y.o.   MRN: [id number]  HPI Mrs. Roger KillHon is a 24yo female presenting today for cold symptoms. - Complains of headaches, productive cough, sore throat, night sweats, left abdominal pain, sneeze, runny nose, fever 2 days ago resolved (101) - Has tried Unknown OTC medicines, Tylenol - Symptoms first started on Saturday - Her 2yo child had pneumonia Tuesday-Wednesday - Besides child, no other friends or family members sick - Reports two positive PPD, scheduled to start TB treatment August 5  Review of Systems  Constitutional: Positive for fever.  HENT: Positive for congestion, rhinorrhea and sneezing.   Respiratory: Positive for cough.   Gastrointestinal: Positive for abdominal pain.       Objective:   Physical Exam  Constitutional: She is oriented to person, place, and time. She appears well-developed and well-nourished. No distress.  HENT:  No sinus tenderness noted  Cardiovascular: Normal rate and regular rhythm.  Exam reveals no gallop and no friction rub.   No murmur heard. Pulmonary/Chest: Effort normal. No respiratory distress. She has no wheezes. She has no rales.  Percussion equal bilaterally  Abdominal: Soft. Bowel sounds are normal. She exhibits no distension. There is no rebound.  Tenderness in lower quadrants L>R, Negative Rovsing's, Negative Murphy's  Musculoskeletal: She exhibits no edema.  Neurological: She is alert and oriented to person, place, and time.  Skin: No rash noted.  Psychiatric: She has a normal mood and affect. Her behavior is normal.       Assessment:     Please refer to Problem List for Assessment.     Plan:     Please refer to Problem List for Plan.

## 2014-07-23 NOTE — Patient Instructions (Signed)
Tuberculosis Tuberculosis (TB) is a serious infection that lasts for years if it is not treated. TB usually attacks the lungs, but almost any part of the body can be affected. TB can be cured with medicines. If TB is not treated completely, it damages the lungs and other parts of the body and may be life-threatening. Caregivers are required by law to report all cases of TB to the Department of Health. The Department of Healthhelps to identify other people who have TB and to protect other people from getting TB. CAUSES  TB is caused by a bacteria called Mycobacterium tuberculosis. It is easily spread from person to person (contagious). This can occur when an infected person coughs or sneezes, releasing tiny droplets into the air. Another person can then breathe the bacteria into the lungs, causing infection. SYMPTOMS   Cough.  Weight loss.  Fatigue.  Fever.  Sweating.  Chills.  Loss of appetite. DIAGNOSIS  Your caregiver may perform a skin test. A substance is injected under the skin, and your caregiver will recheck the area in 48 to 72 hours to see how your body reacts. Your caregiver may also use blood tests, chest X-rays, and sputum tests to determine whether you have TB. TREATMENT  TB is treated with antibiotic medicines. You may need to take antibiotics for 6 to 9 months. Antibiotics commonly given for TB include:  Isoniazid.  Rifampin.  Ethambutol.  Pyrazinamide. HOME CARE INSTRUCTIONS   Take your antibiotics as directed. Finish them even if you start to feel better.  Keep all follow-up appointments as directed by your caregiver. Regular follow-up visits are required to make sure your medicines are working. Follow-up visits are needed for at least 2 years to make sure the illness remains under control.  Tell your caregiver about all of the people you live with or have close contact with. Your caregiver or the Department of Health will contact these people about also being  tested for TB.  Eat a well-balanced diet.  Rest as needed.  Until your caregiver says you are no longer contagious:  Avoid close contact with others, especially babies and elderly people. They are much more likely to catch TB.  Cover your mouth and nose when you cough or sneeze. Dispose of used tissues properly.  Wash your hands frequently with soap and water.  Do not go back to work or school. SEEK MEDICAL CARE IF:   You have new problems that may be caused by your medicine.  You lose your appetite, feel nauseous, or vomit.  Your urine becomes dark yellow.  Your skin or the white part of your eyes turns a yellowish color.  Your symptoms do not go away or get worse.  You have a new cough, or your cough lasts longer than 3 to 4 weeks.  You keep losing weight.  The patient is a baby older than 3 months with a rectal temperature of 100.5 F (38.1 C) or higher for more than 1 day. SEEK IMMEDIATE MEDICAL CARE IF:   You have chest pain or cough up blood.  You have trouble breathing or shortness of breath.  You have a headache or neck stiffness.  You have a fever.  The patient is a baby older than 3 months with a rectal temperature of 102 F (38.9 C) or higher.  The patient is a baby 3 months old or younger with a rectal temperature of 100.4 F (38 C) or higher. MAKE SURE YOU:  Understand these instructions.    Will watch your condition.  Will get help right away if you are not doing well or get worse. Document Released: 12/19/1999 Document Revised: 03/15/2011 Document Reviewed: 03/28/2013 ExitCare Patient Information 2015 [employer]. This information is not intended to replace advice given to you by your health care provider. Make sure you discuss any questions you have with your health care provider.   

## 2014-07-23 NOTE — Assessment & Plan Note (Signed)
-   Suspect symptoms are secondary to viral infection, most likely same organism that contributed to daughter's pneumonia - Continue OTC symptomatic management. No antibiotics indicated at this time - Positive PPD documented and per patient supposed to start medications for TB August 5th. Was asymptomatic for TB at last visit.  - Will repeat CXR to look for acute changes due to symptoms concerning for TB (cough, fever, night sweats)  - Will contact HD and update on symptoms

## 2014-07-24 ENCOUNTER — Telehealth: Payer: Self-pay | Admitting: Family Medicine

## 2014-07-24 ENCOUNTER — Telehealth: Payer: Self-pay | Admitting: *Deleted

## 2014-07-24 NOTE — Telephone Encounter (Signed)
Debbie, RN from 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  Will need positive sputum culture before patient can be diagnosed with active TB.  Patient is due to [facility] on 08/09/14.  Patient "does not have full-blown disease, is not contagious to family or anyone else, and does not need to wear a mask."  Will route info to PCP and Dr. Caroleen Hammanumley.  Informed to have MD call [facility] if they have additional questions or concerns.  Altamese Dilling~Jeannette Richardson, BSN, RN-BC

## 2014-07-24 NOTE — Telephone Encounter (Signed)
Contacted [facility] concerning new symptoms, which are most likely due to virus. Stated that due to being asymptomatic two weeks ago with negative CXR, they will continue to treat for latent tuberculosis without concern for active TB at this time. Recommended contacting [facility] for further questions even though patient is being actively followed by [facility] and is from [place] and that [facility] will then contact [facility].   Dr. Caroleen Hammanumley

## 2014-07-24 NOTE — Progress Notes (Signed)
   Spoke with Seabron SpatesLaura Stines, RN with [facility] on 07/23/14.  Informed her that patient had a positive PPD last month and presented to clinic on 07/23/2014 with cold symptoms: productive cough, night sweats, headache, sore throat, fever and a few other symptoms.  Pt is foreign born that has been in [place] since 2002.  Patient did not have any symptoms at time of PPD placement and reading.  [facility] at that [facility].  However, Patient is now living in [place], [place]. Pt contacted the local health department and is going to start treatment for Latent TB 08/09/2014.  Patient did not wear a mask while seating in the main lobby, she was given a mask once the provider enter the room and started her assessment.  Once she found out that patient had positive PPD; the provider and CMA put on their N95 masks.  Vernona RiegerLaura stated we did the correct thing once acknowledgement of recent positive PPD.  Vernona RiegerLaura gave me the name of Jorene MinorsConnie Jones ext [phone], who she will be investigating the case in more detailed.  Patient contacted the health department in [place] regarding the matter; they spoke with Altamese DillingJeannette Richardson, RN today 07/24/14 and stated patient is being treated for Latent TB and does not need anymore follow up.  Jorene Minorsonnie Jones, RN was informed and no further follow up with Family Medicine. Clovis PuMartin, Tamika L, RN

## 2014-09-09 IMAGING — US US OB DETAIL+14 WK
1 series · 12 of 28 positions shown · non-contrast
Comparison: none

[Series 1: us ob comp +14 wk · 12 of 81 slices shown]
[im 3/81]
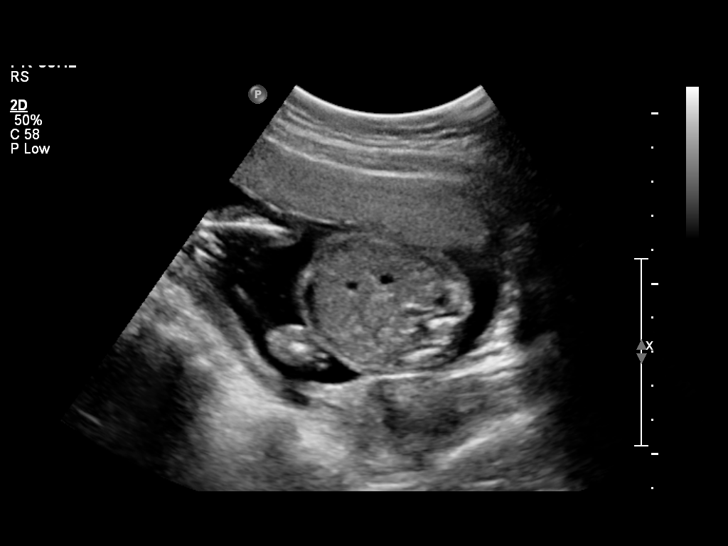
[im 9/81]
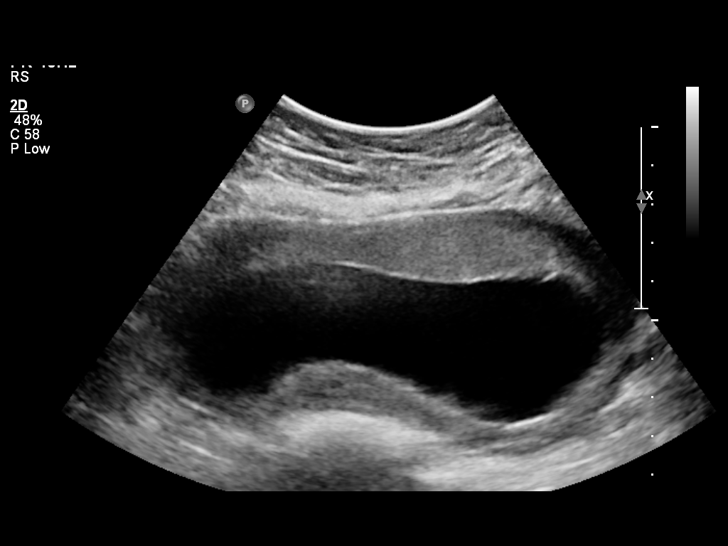
[im 15/81]
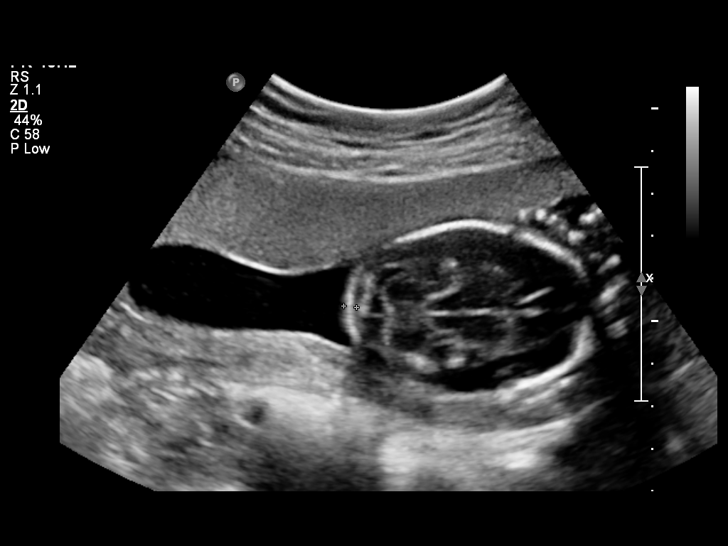
[im 24/81]
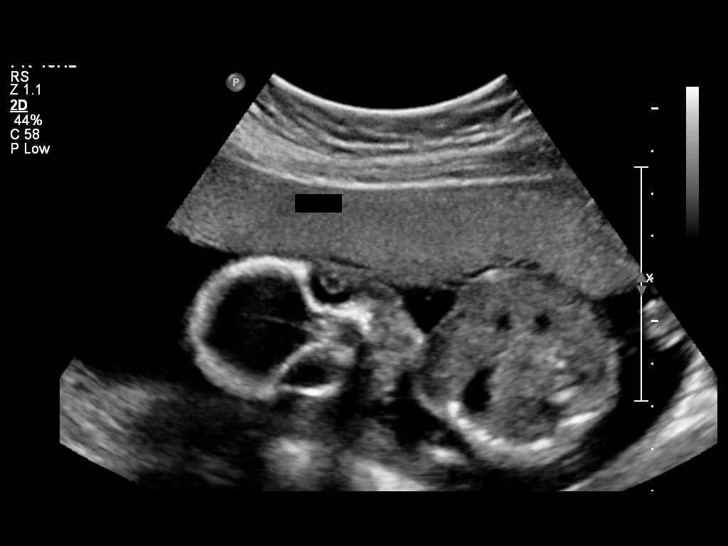
[im 30/81]
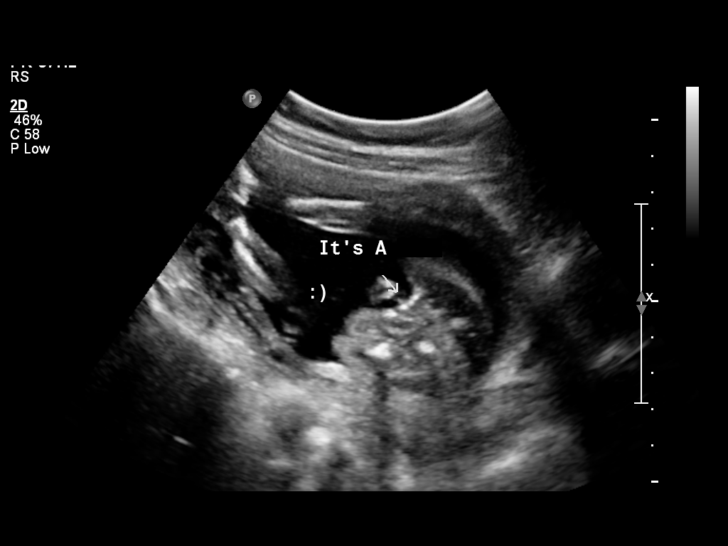
[im 36/81]
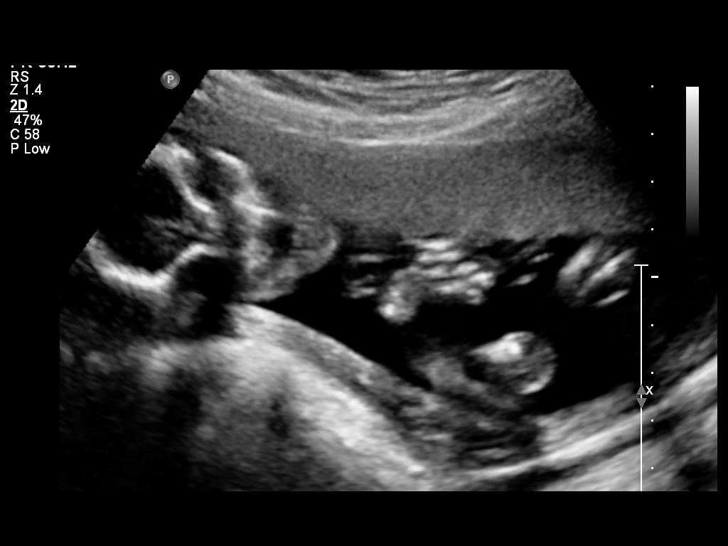
[im 45/81]
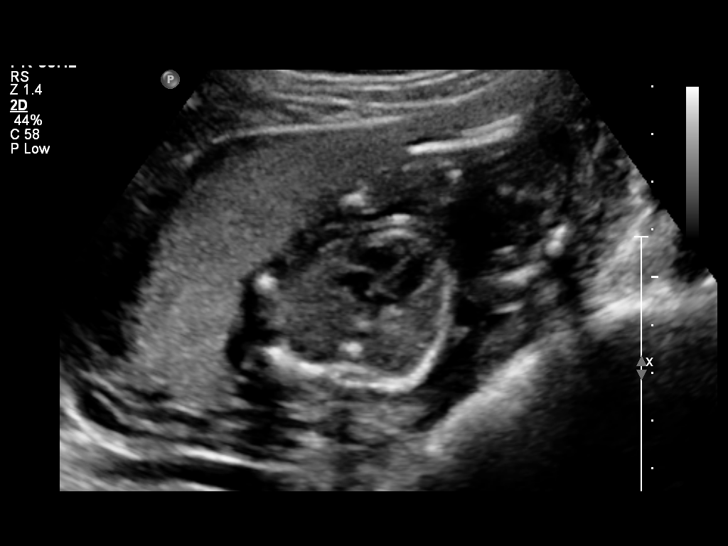
[im 51/81]
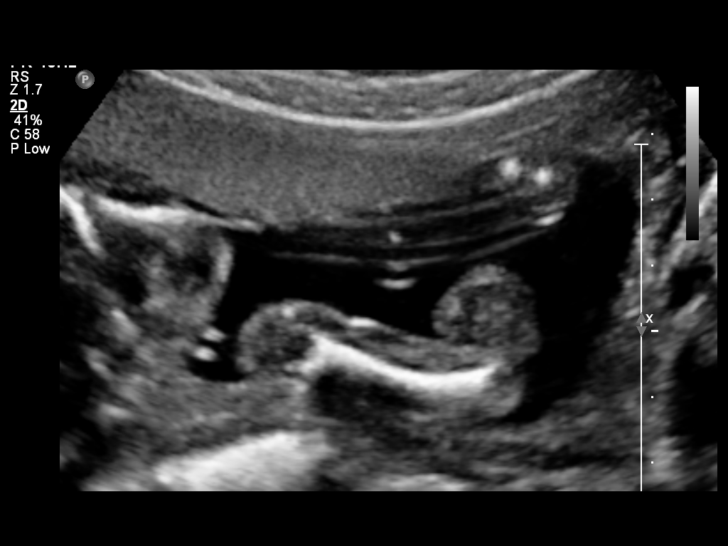
[im 57/81]
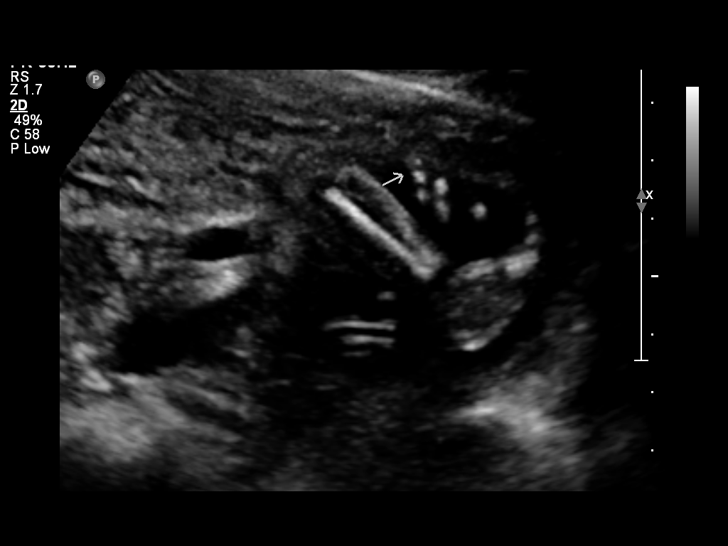
[im 66/81]
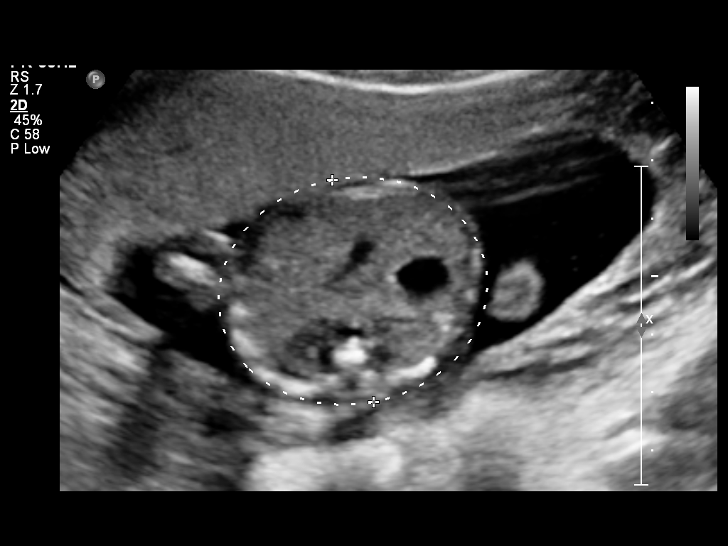
[im 72/81]
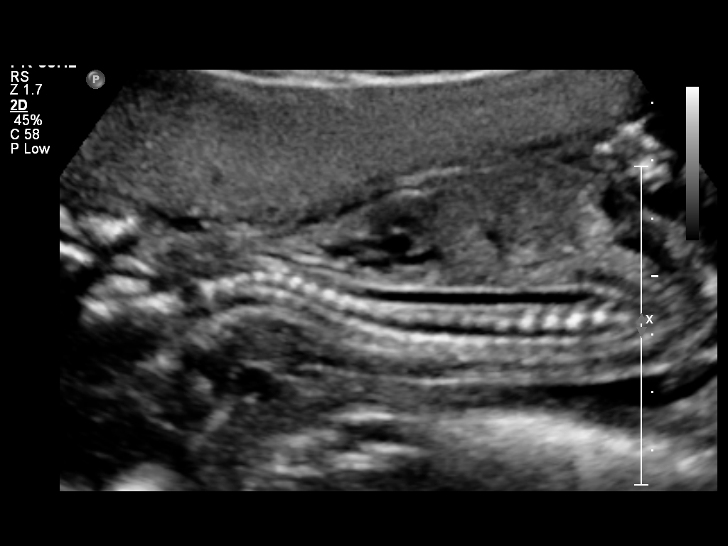
[im 78/81]
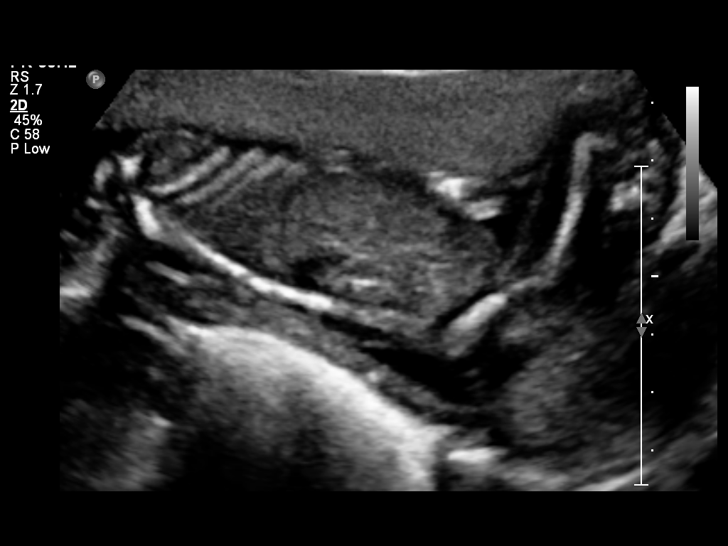

[12 of 28 positions shown; findings below may reference images not displayed]

OBSTETRICS REPORT
                    (Corrected Final 02/23/2012 [DATE])

Service(s) Provided

 US OB DETAIL + 14 WK                                  76811.0
Indications

 Detailed fetal anatomic survey
Fetal Evaluation

 Num Of Fetuses:    1
 Fetal Heart Rate:  153                          bpm
 Cardiac Activity:  Observed
 Presentation:      Breech
 Placenta:          Anterior, above cervical os
 P. Cord            Visualized, central
 Insertion:

 Amniotic Fluid
 AFI FV:      Subjectively within normal limits
                                             Larg Pckt:     4.5  cm
Biometry

 BPD:     40.3  mm     G. Age:  18w 1d                CI:        64.54   70 - 86
                                                      FL/HC:      17.1   16.1 -

 HC:     161.3  mm     G. Age:  19w 0d       21  %    HC/AC:      1.20   1.09 -

 AC:     134.4  mm     G. Age:  19w 0d       29  %    FL/BPD:
 FL:      27.6  mm     G. Age:  18w 3d       13  %    FL/AC:      20.5   20 - 24
 HUM:     27.6  mm     G. Age:  18w 6d       36  %
 NFT:     3.01  mm

 Est. FW:     253  gm      0 lb 9 oz     32  %
Gestational Age

 LMP:           19w 3d        Date:  10/09/11                 EDD:   07/15/12
 U/S Today:     18w 4d                                        EDD:   07/21/12
 Best:          19w 3d     Det. By:  LMP  (10/09/11)          EDD:   07/15/12
2nd Trimester Genetic Sonogram - Trisomy 21 Screening

 Age:                                             22          Risk=1:   885
 Structural anomalies (inc. cardiac):             N/A
 Echogenic bowel:                                 No
 Hypoplastic / absent midphalanx 5th Digit:       No
 Wide space 8st-5nd toes:                         N/A
 Pyelectasis:                                     No
 2-vessel umbilical cord:                         No
 Echogenic cardiac foci:                          No

 Incomplete anatomic evaluation today
Anatomy

 Cranium:          Appears normal         Aortic Arch:      Appears normal
 Fetal Cavum:      Appears normal         Ductal Arch:      Appears normal
 Ventricles:       Appears normal         Diaphragm:        Appears normal
 Choroid Plexus:   Appears normal         Stomach:          Appears normal, left
                                                            sided
 Cerebellum:       Appears normal         Abdomen:          Appears normal
 Posterior Fossa:  Appears normal         Abdominal Wall:   Not well visualized
 Nuchal Fold:      Appears normal         Cord Vessels:     Appears normal (3
                                                            vessel cord)
 Face:             Appears normal         Kidneys:          Appear normal
                   (orbits and profile)
 Lips:             Appears normal         Bladder:          Appears normal
 Heart:            Appears normal         Spine:            Not well visualized
                   (4CH, axis, and
                   situs)
 RVOT:             Not well visualized    Lower             Appears normal
                                          Extremities:
 LVOT:             Appears normal         Upper             Appears normal
                                          Extremities:

 Other:  Female gender. Heels and 5th digit visualized.
Cervix Uterus Adnexa

 Cervical Length:    3.3      cm

 Cervix:       Normal appearance by transabdominal scan.
 Uterus:       No abnormality visualized.
 Left Ovary:    No adnexal mass visualized.
 Right Ovary:   No adnexal mass visualized.

 Adnexa:     No abnormality visualized.
Impression

 Siup demonstrating an EGA by ultrasound of 18w 4d. This
 corresponds well with expected EGA by LMP of 19w 3d.

 The fetal spine, anterior abdominal wall and right cardiac
 outflow were incompletely assessed due to fetal position.
 Visualized anatomy appears normal. No soft markers for
 Down Syndrome are seen. Follow up to reassess fetal
 anatomy can be performed in 1-2 weeks.

 No focal placental abnormality is seen.

 Subjectively and quantitatively normal amniotic fluid volume.
 Normal cervical length.
 questions or concerns.
                 Attending Physician, TINGEL

## 2014-09-23 ENCOUNTER — Ambulatory Visit: Payer: Medicaid Other | Admitting: Family Medicine

## 2014-10-04 ENCOUNTER — Encounter: Payer: Self-pay | Admitting: Family Medicine

## 2014-10-04 ENCOUNTER — Ambulatory Visit (INDEPENDENT_AMBULATORY_CARE_PROVIDER_SITE_OTHER): Payer: Medicaid Other | Admitting: Family Medicine

## 2014-10-04 VITALS — BP 121/58 | HR 67 | Temp 98.4°F | Wt 105.0 lb

## 2014-10-04 DIAGNOSIS — F32A Depression, unspecified: Secondary | ICD-10-CM

## 2014-10-04 DIAGNOSIS — F329 Major depressive disorder, single episode, unspecified: Secondary | ICD-10-CM | POA: Diagnosis present

## 2014-10-04 MED ORDER — CITALOPRAM HYDROBROMIDE 20 MG PO TABS: ORAL_TABLET | ORAL | Status: DC
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
## 2014-10-04 NOTE — Patient Instructions (Signed)
It was a pleasure seeing you today in our clinic. Today we discussed your depression. Here is the treatment plan we have discussed and agreed upon together:   - I have started you on Celexa. Begin taking this tomorrow night. For the next 7 days take 1 tablet each day. After that begin taking 2 tablets every day. - I would like to have you follow up in 4 weeks to reevaluate your response to this new medication. - If you have any thoughts of harming herself or anyone around you I believe it is critical that you contact our office, 9-1-1,  or support hotline for help.

## 2014-10-04 NOTE — Assessment & Plan Note (Signed)
Patient is having significant issues dealing with depression at this time. She had been started on Zoloft couple months ago which had an initial positive response however this is no longer providing much benefit. Patient stopped taking this medication 3 days ago without evidence of withdrawal symptoms. - I've written a prescription for Celexa. 20 mg for the first 7 days, 40 mg every day after. I discussed this medication with the patient and asked that she wait until tomorrow night before taking her first dose. I also explained to her that the discontinuation of SSRIs without an appropriate taper can put her at risk for harmful withdrawal symptoms. - Patient is been asked to follow-up in 4 weeks for reevaluation.

## 2014-10-09 IMAGING — US US ABDOMEN COMPLETE
1 series · 13 of 25 positions shown · non-contrast
Comparison: None.

CLINICAL DATA: Postprandial right upper quadrant pain.  23 weeks
EGA.

COMPLETE ABDOMINAL ULTRASOUND

[Series 1: us abdomen complete · 13 of 74 slices shown]
[im 1/74]
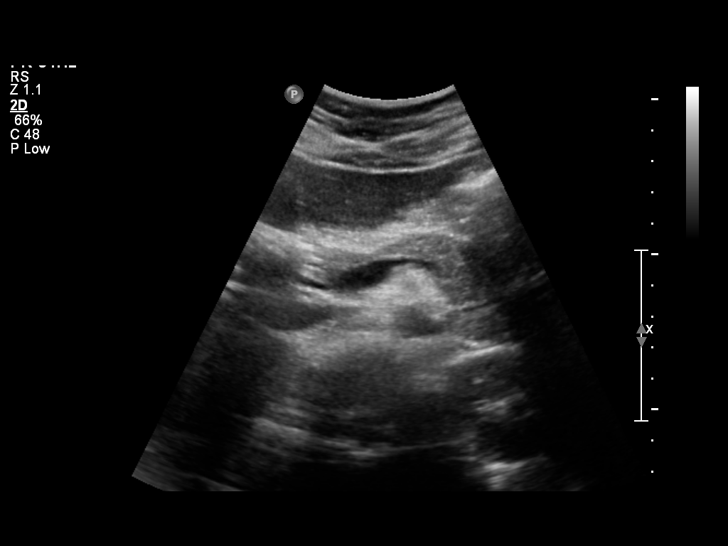
[im 7/74]
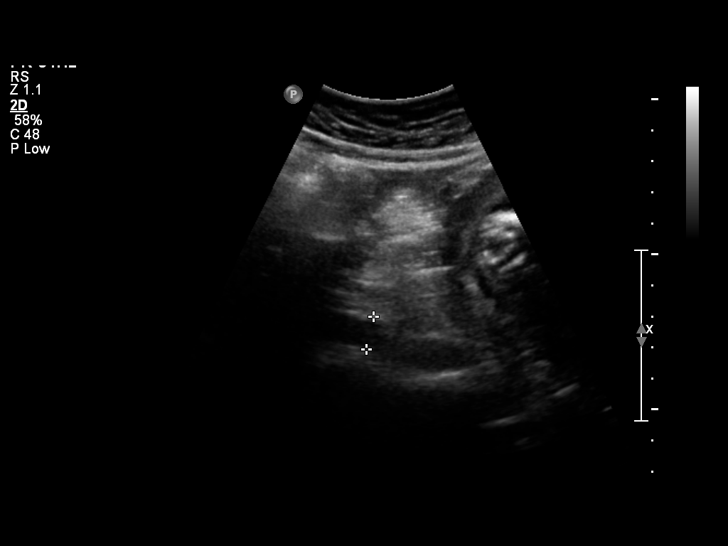
[im 13/74]
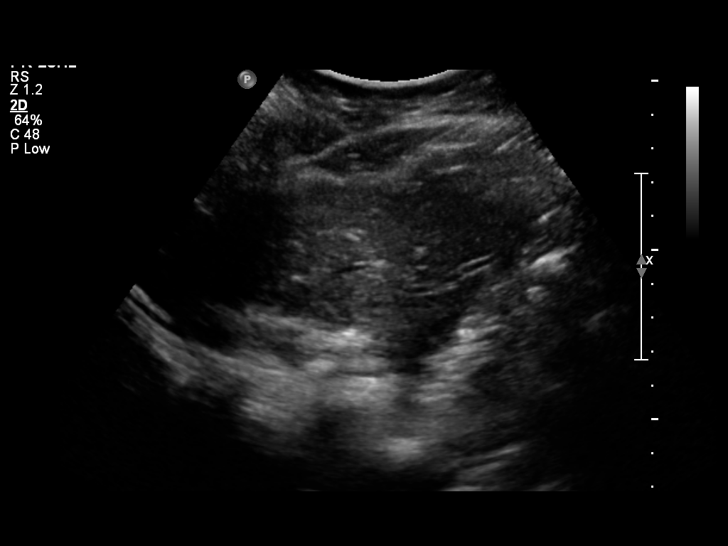
[im 19/74]
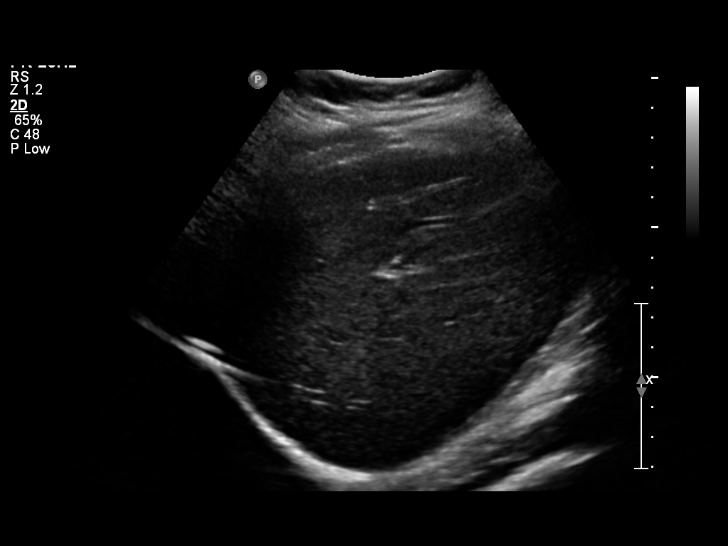
[im 25/74]
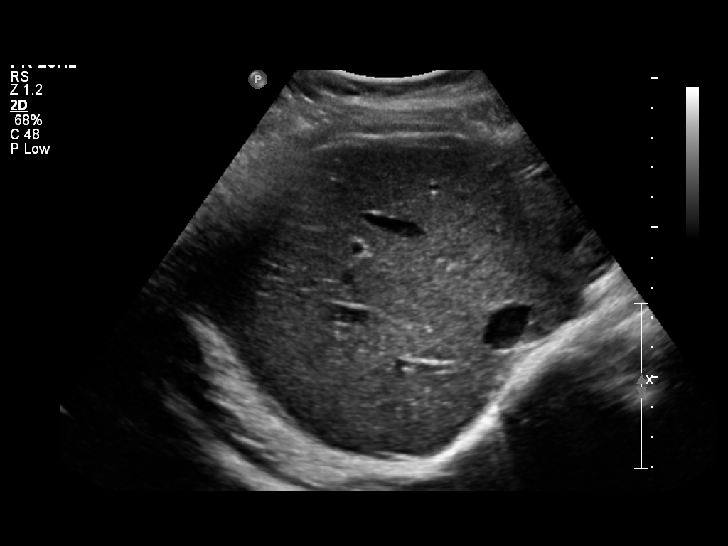
[im 31/74]
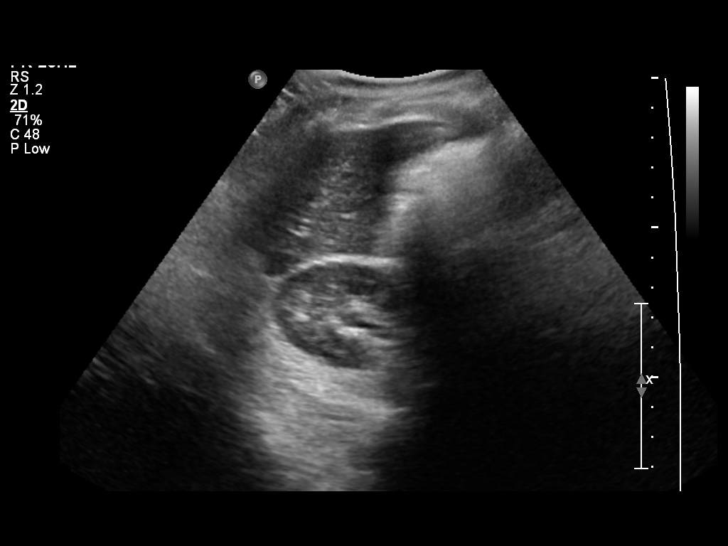
[im 37/74]
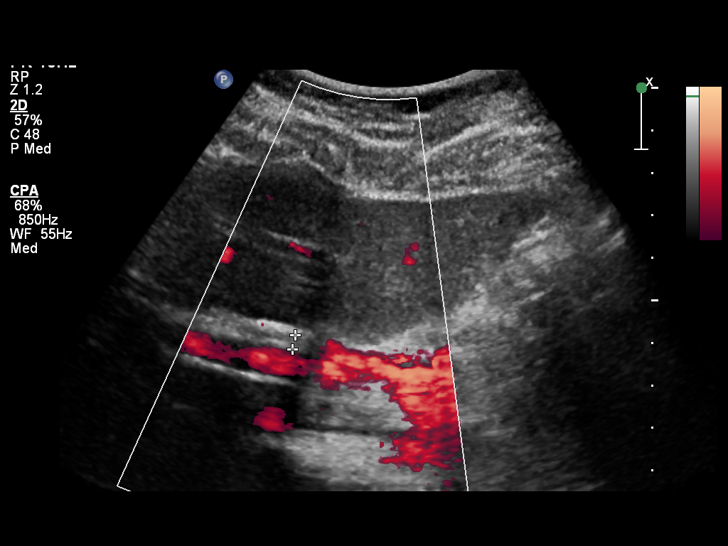
[im 43/74]
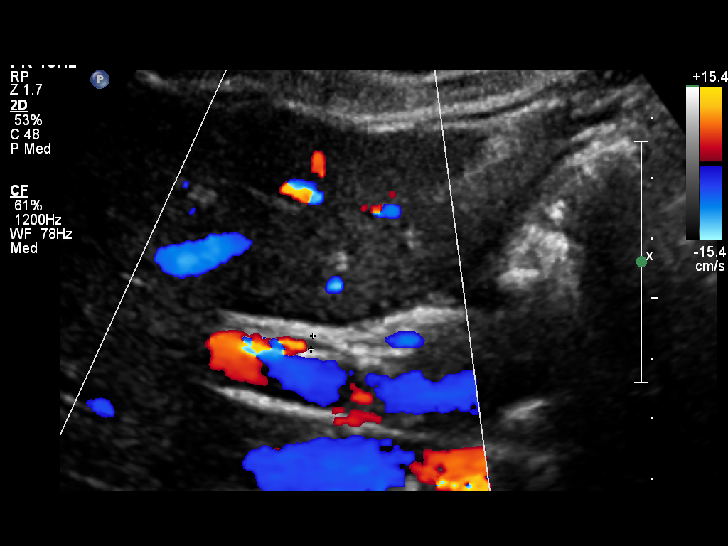
[im 49/74]
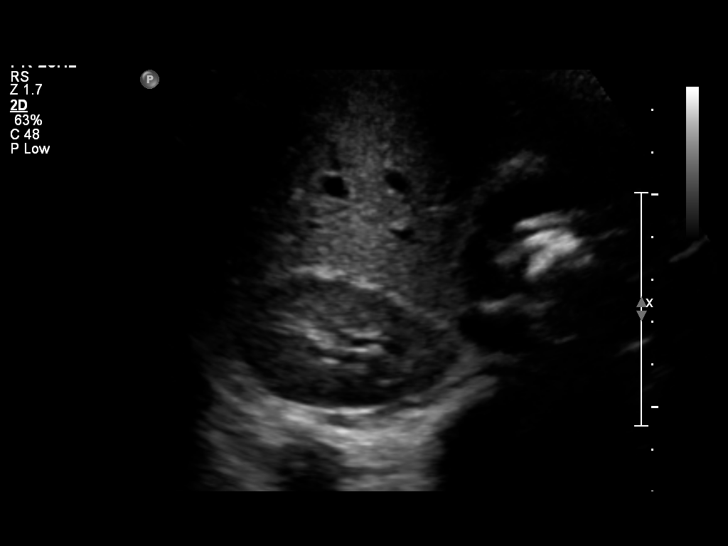
[im 55/74]
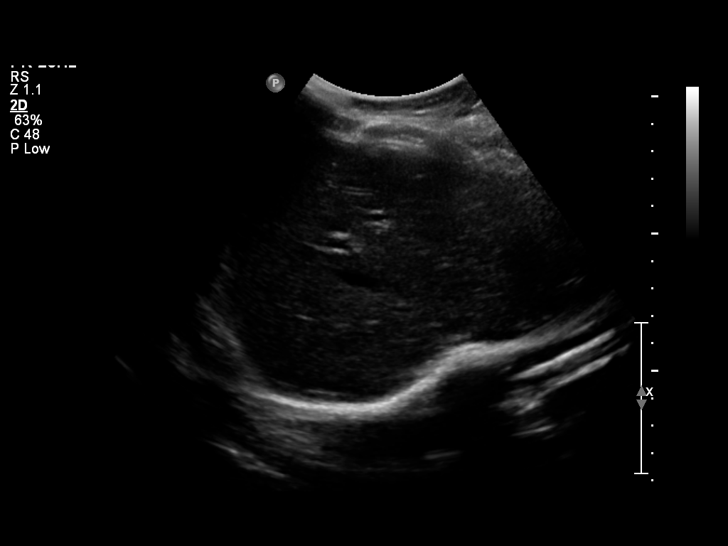
[im 61/74]
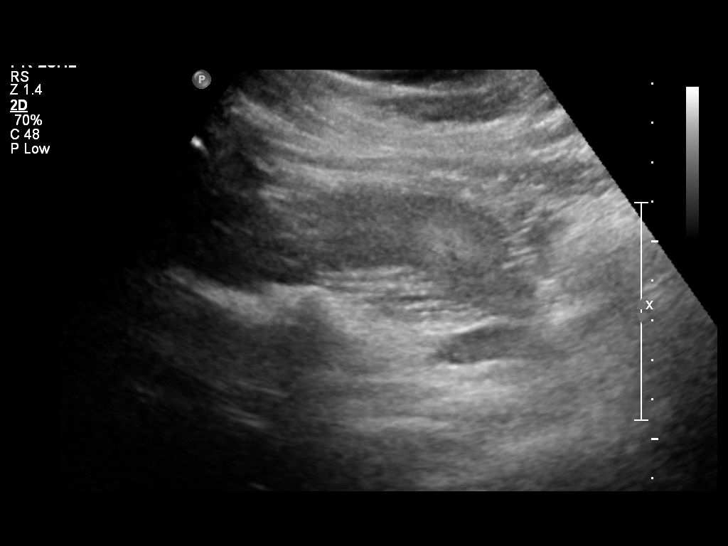
[im 67/74]
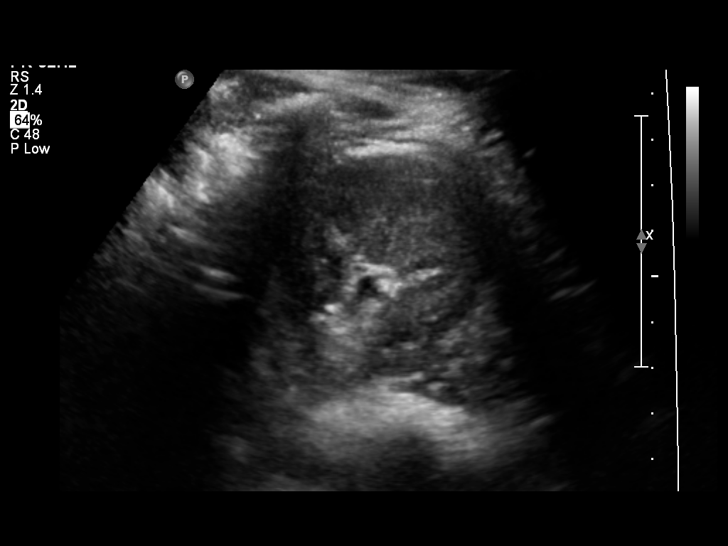
[im 74/74]
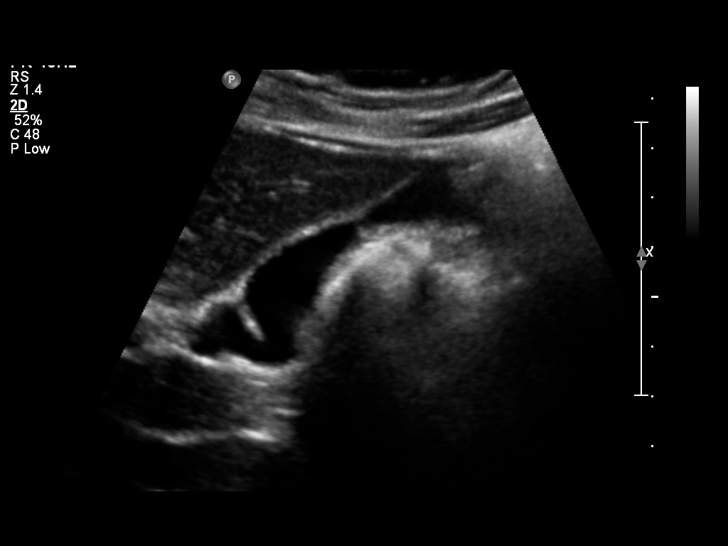

[13 of 25 positions shown; findings below may reference images not displayed]

FINDINGS: Gallbladder:  The gallbladder is well distended and shows no
evidence for intraluminal stones or sludge.  No pericholecystic
fluid or gallbladder wall thickening is seen.  Evaluation for a
sonographic Murphy's sign is negative

Common bile duct:  Measures 2 mm in diameter and has a normal
appearance

Liver:  Appears homogeneous in echotexture with no focal
parenchymal abnormality or signs of intrahepatic ductal dilatation

IVC:  The proximal portion appears normal

Pancreas:  Appears normal in size and echotexture

Spleen:  Has a sagittal length of 5.4 cm with no focal parenchymal
abnormality identified

Right Kidney:  Demonstrates a sagittal length of 9.1 cm.  Mild
pelvic dilatation is noted with no associated caliectasis and this
would correlate with the current gravid status.  No focal
parenchymal abnormalities are identified

Left Kidney:  Has a sagittal length of 9.5 cm.  No focal
parenchymal abnormalities or signs of hydronephrosis are seen

Abdominal aorta:  Has a caliber of 1.3 cm with no aneurysmal
dilatation evident.
IMPRESSION: Mild right renal pyelectasis without associated caliectasis,
correlating with the current 23-week gravid status.

Otherwise unremarkable abdominal ultrasound

## 2014-11-07 ENCOUNTER — Ambulatory Visit: Payer: Medicaid Other | Admitting: Family Medicine

## 2014-11-12 ENCOUNTER — Ambulatory Visit (INDEPENDENT_AMBULATORY_CARE_PROVIDER_SITE_OTHER): Payer: Medicaid Other | Admitting: Family Medicine

## 2014-11-12 ENCOUNTER — Encounter: Payer: Self-pay | Admitting: Family Medicine

## 2014-11-12 VITALS — BP 123/69 | HR 85 | Temp 98.0°F | Ht 59.0 in | Wt 108.6 lb

## 2014-11-12 DIAGNOSIS — F329 Major depressive disorder, single episode, unspecified: Secondary | ICD-10-CM

## 2014-11-12 DIAGNOSIS — F32A Depression, unspecified: Secondary | ICD-10-CM

## 2014-11-12 DIAGNOSIS — Z30013 Encounter for initial prescription of injectable contraceptive: Secondary | ICD-10-CM | POA: Diagnosis present

## 2014-11-12 LAB — POCT URINE PREGNANCY: Preg Test, Ur: NEGATIVE

## 2014-11-12 MED ORDER — CITALOPRAM HYDROBROMIDE 40 MG PO TABS: 40.0000 mg | ORAL_TABLET | Freq: Every day | ORAL | Status: DC

## 2014-11-12 MED ORDER — MEDROXYPROGESTERONE ACETATE 150 MG/ML IM SUSP
150.0000 mg | Freq: Once | INTRAMUSCULAR | Status: AC
  Administered 2014-11-12: 150 mg via INTRAMUSCULAR

## 2014-11-12 NOTE — Assessment & Plan Note (Signed)
Discussed options with patient. She is not interested in IUD or nexplanon at this time. Would prefer depo. Is aware of risk of irregular bleeding, has been on depo before. - Last unprotected sex 1 month ago. Urine pregnancy test negative.  -depo given today. Return for next shot in 3 months.

## 2014-11-12 NOTE — Assessment & Plan Note (Signed)
Much improved on celexa 40mg  daily. Patient happy with this dose. Has a counselor she speaks with about the issues going on at home. Feels safe. Refill celexa. Follow up in 3 months, sooner if needed.
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
## 2014-11-12 NOTE — Patient Instructions (Signed)
Refilled celexa Depo shot today  Follow up in 3 months with Dr. Althea CharonKaramalegos for depression, sooner if needed. Can get next depo shot at that time.  Be well, Dr. Pollie MeyerMcIntyre

## 2015-02-07 ENCOUNTER — Ambulatory Visit (INDEPENDENT_AMBULATORY_CARE_PROVIDER_SITE_OTHER): Payer: Medicaid Other | Admitting: *Deleted

## 2015-02-07 DIAGNOSIS — Z3042 Encounter for surveillance of injectable contraceptive: Secondary | ICD-10-CM

## 2015-02-07 MED ORDER — MEDROXYPROGESTERONE ACETATE 150 MG/ML IM SUSP
150.0000 mg | Freq: Once | INTRAMUSCULAR | Status: AC
  Administered 2015-02-07: 150 mg via INTRAMUSCULAR

## 2015-02-07 NOTE — Progress Notes (Signed)
   Pt in for Depo Provera injection.  Pt tolerated Depo injection. Depo given left upper outer quadrant.  Next injection due April 25-May 09, 2015.  Reminder card given. Clovis Pu, RN

## 2015-02-14 ENCOUNTER — Encounter: Payer: Self-pay | Admitting: Family Medicine

## 2015-02-14 ENCOUNTER — Ambulatory Visit (INDEPENDENT_AMBULATORY_CARE_PROVIDER_SITE_OTHER): Payer: Medicaid Other | Admitting: Family Medicine

## 2015-02-14 VITALS — BP 116/63 | HR 82 | Temp 98.1°F | Ht 59.0 in | Wt 111.8 lb

## 2015-02-14 DIAGNOSIS — F4310 Post-traumatic stress disorder, unspecified: Secondary | ICD-10-CM | POA: Diagnosis not present

## 2015-02-14 DIAGNOSIS — F32A Depression, unspecified: Secondary | ICD-10-CM

## 2015-02-14 DIAGNOSIS — F329 Major depressive disorder, single episode, unspecified: Secondary | ICD-10-CM

## 2015-02-14 MED ORDER — CITALOPRAM HYDROBROMIDE 40 MG PO TABS: 40.0000 mg | ORAL_TABLET | Freq: Every day | ORAL | Status: DC

## 2015-02-14 NOTE — Progress Notes (Signed)
   Subjective:    Patient ID: Amber Hubbard, female    DOB: [birth year], 25 y.o.   MRN: [id number]  Amber Hubbard is a 25 y.o. female presenting on 02/14/2015 for Medication Refill  HPI  FOLLOW-UP, DEPRESSION: - Last visits at [facility] for same complaint 09/2014 and 11/2014 - Patient was started on Celexa in 09/2014, transitioned from Zoloft previously (ineffective) - Today she reports doing well. Good stable mood on Celexa, much better than Zoloft. Minimal anxiety. Does admit to history of PTSD. Currently lives with husband and daughter, will be moving to [place] to stay with her father who is currently ill. She is in school at [employer] for [employer]. - Admits to some reduced amt sleep (she has 2.5 yr daughter at home, making sleep difficult) - Good appetite, energy - Denies any sadness, anxiety, change in stressors, fatigue, insomnia, suicidal or homicidal ideations   Social History  Substance Use Topics  . Smoking status: Never Smoker   . Smokeless tobacco: Never Used  . Alcohol Use: No    Review of Systems Per HPI unless specifically indicated above     Objective:    BP 116/63 mmHg  Pulse 82  Temp(Src) 98.1 F (36.7 C) (Oral)  Ht  (1.499 m)  Wt 111 lb 12.8 oz (50.712 kg)  BMI 22.57 kg/m2  Wt Readings from Last 3 Encounters:  02/14/15 111 lb 12.8 oz (50.712 kg)  11/12/14 108 lb 9.6 oz (49.261 kg)  10/04/14 105 lb (47.628 kg)    Physical Exam  Constitutional: She is oriented to person, place, and time. She appears well-developed and well-nourished. No distress.  Cardiovascular: Normal rate.   Pulmonary/Chest: Effort normal.  Neurological: She is alert and oriented to person, place, and time.  Skin: Skin is warm and dry. She is not diaphoretic.  Psychiatric: She has a normal mood and affect. Her behavior is normal. Thought content normal.  Pleasant, Good eye contact, No anxiety  Nursing note and vitals reviewed.      Assessment & Plan:   Problem List Items  Addressed This Visit    Depression - Primary    Stable, well controlled on Celexa  daily - No SI/HI, no safety concerns today  Plan: 1. Refilled Celexa, 90 day supply 2. Continue follow-up with counselor, supportive family members 3. Follow-up 6 months, recommend future basic labwork every few years, could consider 2017-2018      Relevant Medications   citalopram (CELEXA) 40 MG tablet   PTSD (post-traumatic stress disorder)   Relevant Medications   citalopram (CELEXA) 40 MG tablet      Meds ordered this encounter  Medications  . citalopram (CELEXA) 40 MG tablet    Sig: Take 1 tablet (40 mg total) by mouth daily.    Dispense:  90 tablet    Refill:  3      Follow up plan: Return in about 6 months (around 08/14/2015) for depression.  Saralyn Pilar, DO [facility], PGY-3

## 2015-02-14 NOTE — Patient Instructions (Signed)
Thank you for coming in to clinic today.  1. Refilled Celexa  daily - given 90 pills for 90 day supply, with refills 2. As discussed, would recommend basic screening blood work once every few years, can get it done next year if you like  Please schedule a follow-up appointment with Dr Althea Charon in 6 to 12 months for Depression / Annual physical  If you have any other questions or concerns, please feel free to call the clinic to contact me. You may also schedule an earlier appointment if necessary.  However, if your symptoms get significantly worse, please go to the Emergency Department to seek immediate medical attention.  Saralyn Pilar, DO [facility]

## 2015-02-14 NOTE — Assessment & Plan Note (Addendum)
Stable, well controlled on Celexa  daily - No SI/HI, no safety concerns today  Plan: 1. Refilled Celexa, 90 day supply 2. Continue follow-up with counselor, supportive family members 3. Follow-up 6 months, recommend future basic labwork every few years, could consider 2017-2018

## 2015-04-08 ENCOUNTER — Telehealth: Payer: Self-pay | Admitting: Family Medicine

## 2015-05-07 ENCOUNTER — Ambulatory Visit (INDEPENDENT_AMBULATORY_CARE_PROVIDER_SITE_OTHER): Payer: Medicaid Other | Admitting: *Deleted

## 2015-05-07 DIAGNOSIS — Z3042 Encounter for surveillance of injectable contraceptive: Secondary | ICD-10-CM

## 2015-05-07 MED ORDER — MEDROXYPROGESTERONE ACETATE 150 MG/ML IM SUSP
150.0000 mg | Freq: Once | INTRAMUSCULAR | Status: AC
  Administered 2015-05-07: 150 mg via INTRAMUSCULAR

## 2015-05-07 NOTE — Progress Notes (Signed)
   Pt in for Depo Provera injection.  Pt tolerated Depo injection. Depo given right upper outer quadrant.  Next injection due July 19-August 06, 2015.  Reminder card given. Clovis PuMartin, Kayden Amend L, RN

## 2015-06-12 ENCOUNTER — Encounter: Payer: Self-pay | Admitting: Family Medicine

## 2015-06-12 ENCOUNTER — Ambulatory Visit (INDEPENDENT_AMBULATORY_CARE_PROVIDER_SITE_OTHER): Payer: Medicaid Other | Admitting: Family Medicine

## 2015-06-12 VITALS — BP 116/79 | HR 79 | Temp 98.6°F | Ht 59.0 in | Wt 128.4 lb

## 2015-06-12 DIAGNOSIS — K625 Hemorrhage of anus and rectum: Secondary | ICD-10-CM | POA: Diagnosis not present

## 2015-06-12 DIAGNOSIS — K602 Anal fissure, unspecified: Secondary | ICD-10-CM | POA: Diagnosis not present

## 2015-06-12 MED ORDER — POLYETHYLENE GLYCOL 3350 17 GM/SCOOP PO POWD: 17.0000 g | Freq: Every day | ORAL | Status: DC | PRN

## 2015-06-12 NOTE — Assessment & Plan Note (Signed)
No active anal fissure on rectal exam today, seems to have prior healed posterior fissure. Possible recently had small fissure since healed. - Treat underlying likely constipation, follow-up

## 2015-06-12 NOTE — Telephone Encounter (Signed)
Opened in error

## 2015-06-12 NOTE — Patient Instructions (Signed)
Thank you for coming in to clinic today.  1. Your symptoms with rectal bleeding and abdominal cramping sound most consistent with some Constipation symptoms, you may have a small internal fissure related to your rectum. Also, there is a very small area of external hemorrhoid tissue on your rectum, maybe this had become swollen or enlarged previously. No problems internally, without internal hemorrhoids 2. Start with Miralax again to see if we can regulate bowel habits and resolve any internal constipation. Use 1 capful 17g daily for few days, then can increase to twice a day for several days, continue at this dose until you have soft regular bowel movements 1-2 a day, do this for several weeks then taper back down, stay well hydrated. If loose watery stools then reduce dose or use less frequently. - Recommend daily fiber supplement in future  If symptoms significantly worsen or change with persistent bleeding or dark stools, fevers/chills, worsening abdominal pain, return sooner  Please schedule a follow-up appointment with Dr Althea CharonKaramalegos or New Doctor within 1 month to follow-up Rectal Bleeding / Constipation  If you have any other questions or concerns, please feel free to call the clinic to contact me. You may also schedule an earlier appointment if necessary.  However, if your symptoms get significantly worse, please go to the Emergency Department to seek immediate medical attention.  Saralyn PilarAlexander Karamalegos, DO [facility]

## 2015-06-12 NOTE — Progress Notes (Signed)
Subjective:    Patient ID: Amber Hubbard, female    DOB: 05/17/1990, 25 y.o.   MRN: [id number]  Amber Hubbard is a 25 y.o. female presenting on 06/12/2015 for Blood In Stools   Patient presents for a same day appointment.   HPI  RECTAL BLEEDING / ABDOMINAL CRAMPING: - Reports symptoms started about 1 week ago, felt like she may have a "cut or fissure" on her rectum, first noticed with bright red bleeding mixed with stool and some on toilet paper, with some pain with stooling. Then symptoms improved, but had lower left abdominal cramping seemed mostly constant for several days. No improvement with bowel movements. Admits to regular appetite. - Reports regular bowel habits with soft well formed stool most days but not everyday, without dry or hardness, no pain normally - Last episode similar in 2014 with dx anal fissure following prior pregnancy, since resolved without any bleeding over past 3 years until now, treated with miralax at that time, used for 2 weeks with resolution - Currently not taking any medicines for this and no fiber supplement - Denies any fevers/chills, nausea vomiting, dark stools, acid reflux or history of PUD  Family history - Father had colon polyps removed in late 109s, no report of cancer - No family history of IBD  Social History  Substance Use Topics  . Smoking status: Never Smoker   . Smokeless tobacco: Never Used  . Alcohol Use: No    Review of Systems Per HPI unless specifically indicated above     Objective:    BP 116/79 mmHg  Pulse 79  Temp(Src) 98.6 F (37 C) (Oral)  Ht  (1.499 m)  Wt 128 lb 6.4 oz (58.242 kg)  BMI 25.92 kg/m2  Wt Readings from Last 3 Encounters:  06/12/15 128 lb 6.4 oz (58.242 kg)  02/14/15 111 lb 12.8 oz (50.712 kg)  11/12/14 108 lb 9.6 oz (49.261 kg)    Physical Exam  Constitutional: She appears well-developed and well-nourished. No distress.  Cardiovascular: Normal rate.   Pulmonary/Chest: Effort normal.    Genitourinary:  External rectum appears normal with possible prior healed midline anal fissure posteriorly but no evidence of active bleeding or open fissure. Non-tender. No erythema or fluctuance. Very mild anterior external hemorrhoidal tissue non-inflamed or swollen. Internal DRE performed non-tender, no evidence of bleeding, no appreciable internal hemorrhoidal tissue. No mass palpated.  Neurological: She is alert.  Skin: Skin is warm and dry. She is not diaphoretic.  Nursing note and vitals reviewed.      Assessment & Plan:   Problem List Items Addressed This Visit    Rectal bleeding - Primary    History suggestive of benign etiology either from anal fissure vs hemorrhoid with bright red blood mixed with stool and some pain, associated with abdominal cramping. Similar to prior history anal fissure in 2014 following pregnancy. Clinically no anal fissure identified today, possible area of prior healed fissure, and very small residual external hemorrhoidal tissue without swelling or engorgement. DRE negative. Not consistent with IBD. Consider possible constipation as underlying etiology.  Plan: 1. Trial miralax 17g daily, titration instructions 2. Improve hydration, fiber supplement 3. Follow-up within 4 weeks - if develops actual anal fissure consider diltiazem 2% gel topical      Anal fissure    No active anal fissure on rectal exam today, seems to have prior healed posterior fissure. Possible recently had small fissure since healed. - Treat underlying likely constipation, follow-up  Relevant Medications   polyethylene glycol powder (GLYCOLAX/MIRALAX) powder      Meds ordered this encounter  Medications  . polyethylene glycol powder (GLYCOLAX/MIRALAX) powder    Sig: Take 17 g by mouth daily as needed (constipation).    Dispense:  3350 g    Refill:  1      Follow up plan: Return in about 4 weeks (around 07/10/2015), or if symptoms worsen or fail to improve, for rectal  bleeding, constipation.  Saralyn PilarAlexander Kailoni Vahle, DO [facility], PGY-3

## 2015-06-12 NOTE — Assessment & Plan Note (Signed)
History suggestive of benign etiology either from anal fissure vs hemorrhoid with bright red blood mixed with stool and some pain, associated with abdominal cramping. Similar to prior history anal fissure in 2014 following pregnancy. Clinically no anal fissure identified today, possible area of prior healed fissure, and very small residual external hemorrhoidal tissue without swelling or engorgement. DRE negative. Not consistent with IBD. Consider possible constipation as underlying etiology.  Plan: 1. Trial miralax 17g daily, titration instructions 2. Improve hydration, fiber supplement 3. Follow-up within 4 weeks - if develops actual anal fissure consider diltiazem 2% gel topical

## 2015-09-29 ENCOUNTER — Ambulatory Visit (INDEPENDENT_AMBULATORY_CARE_PROVIDER_SITE_OTHER): Payer: Medicaid Other | Admitting: Internal Medicine

## 2015-09-29 ENCOUNTER — Encounter: Payer: Self-pay | Admitting: Internal Medicine

## 2015-09-29 DIAGNOSIS — F4310 Post-traumatic stress disorder, unspecified: Secondary | ICD-10-CM

## 2015-09-29 DIAGNOSIS — Z227 Latent tuberculosis: Secondary | ICD-10-CM

## 2015-09-29 DIAGNOSIS — R7611 Nonspecific reaction to tuberculin skin test without active tuberculosis: Secondary | ICD-10-CM | POA: Diagnosis not present

## 2015-09-29 DIAGNOSIS — F32A Depression, unspecified: Secondary | ICD-10-CM

## 2015-09-29 DIAGNOSIS — Z23 Encounter for immunization: Secondary | ICD-10-CM

## 2015-09-29 DIAGNOSIS — F329 Major depressive disorder, single episode, unspecified: Secondary | ICD-10-CM

## 2015-09-29 MED ORDER — CITALOPRAM HYDROBROMIDE 40 MG PO TABS: 40.0000 mg | ORAL_TABLET | Freq: Every day | ORAL | 3 refills | Status: DC

## 2015-09-29 NOTE — Progress Notes (Signed)
25 y.o. year old female presents for well woman/preventative visit and annual GYN examination.  Acute Concerns: Patient hoping to become pregnant again. [id number] with 59 year old daughter. Previously on Depo but last received in April. Has not started having regular periods yet.   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 Lived in a refugee [place] in [place] when she was younger, and thinks she was exposed there. She completed a 9 month treatment course with one medication that she cannot remember the name of. She received her treatment in a clinic in [place]. She denies any coughing, night sweats, weight loss, or difficulty breathing currently.   Diet: Relatively healthy diet, though admits she is eating greasy foods too often, such as pizza and hamburger. Eats these foods when she is busy with work and school and does not have time to [employer]. Does eat vegetables and fruits daily. Eats fast food only once per week. Drinks only water.   Exercise: Does not exercise regularly, though does try to exercise 1-2 times per week. Stays active with her three year old daughter.   Sexual/Birth History: G1P1. Currently trying to become pregnant. Monogamous with husband.  Birth Control: None (trying to conceive)  POA/Living Will:  Social:  Social History   Social History  . Marital status: Married    Spouse name: N/A  . Number of children: N/A  . Years of education: N/A   Social History Main Topics  . Smoking status: Never Smoker  . Smokeless tobacco: Never Used  . Alcohol use No  . Drug use: No  . Sexual activity: Yes    Birth control/ protection: Pill, None   Other Topics Concern  . Not on file   Social History Narrative  . No narrative on file  Originally from [place].  Immunization: Immunization History  Administered Date(s) Administered  . DTP 06/13/2000, 09/26/2000, 04/24/2001  . H1N1 10/31/2007  . Hepatitis B 06/13/2000, 09/26/2000, 04/24/2001  .  Influenza Split 10/07/2010  . Influenza Whole 11/30/2006  . Influenza,inj,Quad PF,36+ Mos 09/29/2015  . MMR 06/13/2000, 09/26/2000  . OPV 09/26/2000, 04/24/2001, 11/09/2001  . PPD Test 06/25/2014, 06/28/2014  . Tdap 05/02/2012    Cancer Screening:  Pap Smear: Last performed 08/2013 with normal results. Next due 2018.  Mammogram: Not currently indicated  Colonoscopy: Not currently indicated  Dexa: Not currently indicated  Physical Exam: VITALS: Reviewed GEN: Pleasant female, NAD HEENT: Normocephalic, PERRL, EOMI, no scleral icterus, nasal septum midline, MMM, uvula midline, no anterior or posterior lymphadenopathy, no thyromegaly CARDIAC:RRR, S1 and S2 present, no murmur, no heaves/thrills RESP: CTAB, normal effort ABD: soft, no tenderness, normal bowel sounds EXT: No edema, 2+ radial and DP pulses SKIN: no rash  ASSESSMENT & PLAN: 25 y.o. female presents for annual well woman/preventative exam. Please see problem specific assessment and plan.   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 Does not remember medication she was treated with, though received treatment at a clinic in [place]. Explained that PPD and quant gold will be positive since she has been previously exposed, and CXR is only way to rule out Tb. Since patient is currently asymptomatic, she does not wish to proceed with imaging at this time. Will continue to monitor and obtain CXR if patient becomes symptomatic in future.   Adin Hector, MD, MPH PGY-2 [facility] Pager [phone]

## 2015-09-29 NOTE — Patient Instructions (Addendum)
It was nice meeting you today!  Please continue taking Celexa as you were. I am pleased that you are already taking prenatal vitamins, and please continue to take these while you are trying to get pregnant.   If you have any questions or concerns, please feel free to call the clinic.   Be well,  Dr. Natale MilchLancaster

## 2015-09-29 NOTE — Assessment & Plan Note (Signed)
Completed 9 month treatment course in June 2017. Does not remember medication she was treated with, though received treatment at a clinic in [place]. Explained that PPD and quant gold will be positive since she has been previously exposed, and CXR is only way to rule out Tb. Since patient is currently asymptomatic, she does not wish to proceed with imaging at this time. Will continue to monitor and obtain CXR if patient becomes symptomatic in future.

## 2015-11-20 ENCOUNTER — Encounter: Payer: Self-pay | Admitting: Family Medicine

## 2015-11-20 ENCOUNTER — Ambulatory Visit (INDEPENDENT_AMBULATORY_CARE_PROVIDER_SITE_OTHER): Payer: Medicaid Other | Admitting: Family Medicine

## 2015-11-20 VITALS — BP 134/60 | HR 73 | Temp 98.5°F | Wt 137.0 lb

## 2015-11-20 DIAGNOSIS — N912 Amenorrhea, unspecified: Secondary | ICD-10-CM | POA: Diagnosis not present

## 2015-11-20 LAB — POCT URINE PREGNANCY: Preg Test, Ur: NEGATIVE

## 2015-11-20 NOTE — Assessment & Plan Note (Signed)
Last Depo April 2017. No periods on Depo and none since. UPT negative today. Patient desiring pregnancy and taking PNVs, never smoker. Counseled on frequency of intercourse to attempt to overlap ovulation window. RTC as needed or if patient desires for additional secondary ammenhorrea workup. I will defer amenorrhea workup as Depo has been shown to cause amenorrhea for up to 18months, unrelated to duration of depo, but related to weight (smaller women tend to ovulate more quickly). Up to date lists 50% of women will conceive within 10 months of stopping Depo.

## 2015-11-20 NOTE — Progress Notes (Signed)
   CC: pregnancy test  HPI Last Depo in April. Been trying since April. No periods at all since April. No periods on Depo either. Last intercourse last night. Feeling nauseous, HA. No vomiting. + Breast tenderness, fullness. Took UPT last week, negative. Felt these with other children. 1 miscarriage at 7 weeks 1-2 years ago. Husband is also FOB of daughter. Taking PNVs. Has only tried gingerale for the nausea.   CC, SH/smoking status, and VS noted  Objective: BP 134/60   Pulse 73   Temp 98.5 F (36.9 C) (Oral)   Wt 137 lb (62.1 kg)   BMI 27.67 kg/m  Gen: NAD, alert, cooperative, and pleasant. HEENT: NCAT, EOMI, PERRL CV: RRR, no murmur Resp: CTAB, no wheezes, non-labored Neuro: Alert and oriented, Speech clear, No gross deficits  Assessment and plan:  Amenorrhea Last Depo April 2017. No periods on Depo and none since. UPT negative today. Patient desiring pregnancy and taking PNVs, never smoker. Counseled on frequency of intercourse to attempt to overlap ovulation window. RTC as needed or if patient desires for additional secondary ammenhorrea workup. I will defer amenorrhea workup as Depo has been shown to cause amenorrhea for up to 18months, unrelated to duration of depo, but related to weight (smaller women tend to ovulate more quickly). Up to date lists 50% of women will conceive within 10 months of stopping Depo.    Orders Placed This Encounter  Procedures  . POCT urine pregnancy    Loni MuseKate Delois Tolbert, MD, PGY1 11/20/2015 2:05 PM

## 2015-11-20 NOTE — Patient Instructions (Signed)
I'm sorry your pregnancy test was negative today. Continue watching for signs and symptoms of pregnancy and test again as you feel necessary. Continue taking your Prenatal vitamins. Depo can keep you from ovulating for several months after stopping. Continue trying to get pregnant as you feel appropriate. If you are not sure when you are ovulating, because you haven't had any periods, intercourse needs to be about every 2-3 days to try to overlap with ovulation.

## 2015-12-17 ENCOUNTER — Ambulatory Visit (HOSPITAL_COMMUNITY)
Admission: EM | Admit: 2015-12-17 | Discharge: 2015-12-17 | Disposition: A | Discharge status: Home / Self Care | Payer: Medicaid Other | Attending: Family Medicine | Admitting: Family Medicine

## 2015-12-17 ENCOUNTER — Encounter (HOSPITAL_COMMUNITY): Payer: Self-pay | Admitting: Family Medicine

## 2015-12-17 DIAGNOSIS — R519 Headache, unspecified: Secondary | ICD-10-CM

## 2015-12-17 DIAGNOSIS — B349 Viral infection, unspecified: Secondary | ICD-10-CM

## 2015-12-17 DIAGNOSIS — R51 Headache: Secondary | ICD-10-CM

## 2015-12-17 LAB — POCT URINALYSIS DIP (DEVICE)
BILIRUBIN URINE: NEGATIVE
GLUCOSE, UA: NEGATIVE mg/dL
HGB URINE DIPSTICK: NEGATIVE
Ketones, ur: NEGATIVE mg/dL
LEUKOCYTES UA: NEGATIVE
NITRITE: NEGATIVE
Protein, ur: NEGATIVE mg/dL
Specific Gravity, Urine: 1.025 (ref 1.005–1.030)
UROBILINOGEN UA: 0.2 mg/dL (ref 0.0–1.0)
pH: 6 (ref 5.0–8.0)

## 2015-12-17 LAB — POCT PREGNANCY, URINE: PREG TEST UR: NEGATIVE

## 2015-12-17 MED ORDER — BUTALBITAL-APAP-CAFFEINE 50-325-40 MG PO TABS: 1.0000 | ORAL_TABLET | Freq: Four times a day (QID) | ORAL | 0 refills | Status: AC | PRN

## 2015-12-17 NOTE — ED Provider Notes (Signed)
[facility]    CSN: [id number] Arrival date & time: 12/17/15  1118     History   Chief Complaint Chief Complaint  Patient presents with  . Headache    HPI Amber Hubbard is a 25 y.o. female.   This is a 25 year old woman comes in with 3 weeks of nausea, myalgia, and headache. She has no localizing symptoms such as cough, sinus congestion, sore throat, abdominal pain, or painful urination. She's had an intermittent fever during this time.  Patient is studying early childhood development and has a 5450-month-old child home was been sick.      Past Medical History:  Diagnosis Date  . Medical history non-contributory     Patient Active Problem List   Diagnosis Date Noted  . Amenorrhea 11/20/2015  . Rectal bleeding 06/12/2015  . 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 Depression 06/23/2014  . Anal fissure 10/20/2012  . Contraception management 08/16/2012  . Night sweats 03/26/2011  . PTSD (post-traumatic stress disorder) 02/15/2010  . Dysmenorrhea 02/13/2010    Past Surgical History:  Procedure Laterality Date  . TONSILLECTOMY      OB History    Gravida Para Term Preterm AB Living   1 1 1     1    SAB TAB Ectopic Multiple Live Births           1       Home Medications    Prior to Admission medications   Medication Sig Start Date End Date Taking? Authorizing Provider  butalbital-acetaminophen-caffeine (FIORICET, ESGIC) [phone] MG tablet Take 1-2 tablets by mouth every 6 (six) hours as needed for headache. 12/17/15 12/16/16  Elvina SidleKurt Aissata Wilmore, MD  citalopram (CELEXA) 40 MG tablet Take 1 tablet (40 mg total) by mouth daily. 09/29/15   Marquette SaaAbigail Joseph Lancaster, MD  Prenatal Vit-Fe Fumarate-FA (PRENATAL VITAMINS) 28-0.8 MG TABS Take 1 tablet by mouth daily. 08/16/13   Latrelle DodrillBrittany J McIntyre, MD    Family History Family History  Problem Relation Age of Onset  . Hypertension Mother   . Hypertension Father     Social History Social History    Substance Use Topics  . Smoking status: Never Smoker  . Smokeless tobacco: Never Used  . Alcohol use No     Allergies   Patient has no known allergies.   Review of Systems Review of Systems  Constitutional: Positive for fatigue and fever.  HENT: Negative.   Eyes: Negative.   Respiratory: Negative.   Cardiovascular: Negative.   Gastrointestinal: Positive for nausea. Negative for vomiting.  Genitourinary: Negative.   Musculoskeletal: Positive for myalgias.  Neurological: Negative.   Psychiatric/Behavioral: Negative.      Physical Exam Triage Vital Signs ED Triage Vitals [12/17/15 1148]  Enc Vitals Group     BP 134/75     Pulse Rate 68     Resp 16     Temp 98.2 F (36.8 C)     Temp Source Oral     SpO2 98 %     Weight      Height      Head Circumference      Peak Flow      Pain Score      Pain Loc      Pain Edu?      Excl. in GC?    No data found.   Updated Vital Signs BP 134/75 (BP Location: Right Arm)   Pulse 68   Temp 98.2 F (36.8 C) (Oral)   Resp  16   SpO2 98%       Physical Exam  Constitutional: She is oriented to person, place, and time. She appears well-developed and well-nourished.  HENT:  Head: Normocephalic.  Right Ear: External ear normal.  Left Ear: External ear normal.  Mouth/Throat: Oropharynx is clear and moist.  Eyes: Conjunctivae and EOM are normal.  Neck: Normal range of motion. Neck supple.  Cardiovascular: Normal rate, regular rhythm and normal heart sounds.   Pulmonary/Chest: Effort normal and breath sounds normal.  Abdominal: Soft. Bowel sounds are normal. There is no tenderness.  Musculoskeletal: Normal range of motion.  Neurological: She is alert and oriented to person, place, and time.  Skin: Skin is warm and dry.  Nursing note and vitals reviewed.    UC Treatments / Results  Labs (all labs ordered are listed, but only abnormal results are displayed) Labs Reviewed  POCT URINALYSIS DIP (DEVICE)  POCT PREGNANCY,  URINE    EKG  EKG Interpretation None       Radiology No results found.  Procedures Procedures (including critical care time)  Medications Ordered in UC Medications - No data to display   Initial Impression / Assessment and Plan / UC Course  I have reviewed the triage vital signs and the nursing notes.  Pertinent labs & imaging results that were available during my care of the patient were reviewed by me and considered in my medical decision making (see chart for details).  Clinical Course      Final Clinical Impressions(s) / UC Diagnoses   Final diagnoses:  Headache due to viral infection    New Prescriptions New Prescriptions   BUTALBITAL-ACETAMINOPHEN-CAFFEINE (FIORICET, ESGIC) 50-325-40 MG TABLET    Take 1-2 tablets by mouth every 6 (six) hours as needed for headache.     Elvina SidleKurt My Madariaga, MD 12/17/15 1212

## 2015-12-17 NOTE — ED Triage Notes (Addendum)
[facility] with a complaint of a headache x 2 days with a fever and general body aches earlier.

## 2015-12-17 NOTE — Discharge Instructions (Signed)
Your lab tests are normal. Ear exam is normal.  I believe you have a viral form of headache. I'm prescribing a medicine that should help with this. You may also take ibuprofen as needed.  If your symptoms continue, further tests will be necessary. We made her come back here or see her primary care doctor.

## 2016-03-09 ENCOUNTER — Ambulatory Visit (INDEPENDENT_AMBULATORY_CARE_PROVIDER_SITE_OTHER): Payer: Medicaid Other | Admitting: Family Medicine

## 2016-03-09 ENCOUNTER — Encounter: Payer: Self-pay | Admitting: Family Medicine

## 2016-03-09 VITALS — BP 110/64 | HR 73 | Temp 98.3°F | Wt 143.0 lb

## 2016-03-09 DIAGNOSIS — N649 Disorder of breast, unspecified: Secondary | ICD-10-CM

## 2016-03-09 NOTE — Patient Instructions (Signed)
Thank you so much for coming to visit today! We will get a mammogram and ultrasound to further evaluate this lesion. No antibiotics are indicated at this time. You may use Ibuprofen or Tylenol for pain. You may also try covering it with a bandaid to offer protection from clothes rubbing. Please return if you notice it getting red, warm, or draining.   Dr. Caroleen Hammanumley

## 2016-03-10 NOTE — Progress Notes (Signed)
Subjective:     Patient ID: Lacie ScottsUyen E Presley, female   DOB: 1990-06-30, 26 y.o.   MRN: [id number]  HPI Mrs. Roger KillHon is a 25yo female presenting today for right breast pain. First noted Sunday 3/4 and thought it was just a pimple. Lesion has continued to grow in size and is now very tender. Cannot wear a bra since it irritates it. Denies redness or drainage from lesion as well as nipple. Denies fever. Denies any masses. Does have history of breastfeeding, but this was several years ago. Nonsmoker  Review of Systems Per HPI    Objective:   Physical Exam  Constitutional: She appears well-developed and well-nourished. No distress.  Cardiovascular: Normal rate and regular rhythm.   No murmur heard. Pulmonary/Chest: Effort normal. No respiratory distress. She has no wheezes.  Skin:  Raised flesh colored 2cmx2cm papule in areola of right breast noted at 8o'clock position in right lower quadrant. Tenderness extends beyond lesion. No fluctuance palpable. No masses noted on breast exam.  Psychiatric: She has a normal mood and affect. Her behavior is normal.      Assessment and Plan:     1. Breast lesion Concerning for underlying abscess or malignancy. Will obtain mammogram and ultrasound of right breast. No signs of acute infection without redness, warmth, or drainage--antibiotics not indicated. Recommend covering with bandage to prevent rubbing and hopefully help with pain and irritation. May also use Ibuprofen or Tylenol for pain. Follow up following imaging. Return sooner if redness, warmth, or drainage occurs or if lesion continues to grow larger. - US BREAST LTD UNI RIGHT INC AXILLA; Future

## 2016-03-15 ENCOUNTER — Ambulatory Visit
Admission: RE | Admit: 2016-03-15 | Discharge: 2016-03-15 | Disposition: A | Discharge status: Home / Self Care | Payer: Medicaid Other | Source: Ambulatory Visit | Attending: Family Medicine | Admitting: Family Medicine

## 2016-03-15 DIAGNOSIS — N649 Disorder of breast, unspecified: Secondary | ICD-10-CM

## 2016-04-17 ENCOUNTER — Encounter: Payer: Self-pay | Admitting: Emergency Medicine

## 2016-04-17 ENCOUNTER — Ambulatory Visit (HOSPITAL_COMMUNITY)
Admission: EM | Admit: 2016-04-17 | Discharge: 2016-04-17 | Disposition: A | Discharge status: Home / Self Care | Payer: Medicaid Other | Attending: Internal Medicine | Admitting: Internal Medicine

## 2016-04-17 DIAGNOSIS — R1030 Lower abdominal pain, unspecified: Secondary | ICD-10-CM | POA: Insufficient documentation

## 2016-04-17 DIAGNOSIS — R109 Unspecified abdominal pain: Secondary | ICD-10-CM | POA: Diagnosis present

## 2016-04-17 DIAGNOSIS — R358 Other polyuria: Secondary | ICD-10-CM | POA: Insufficient documentation

## 2016-04-17 DIAGNOSIS — Z9889 Other specified postprocedural states: Secondary | ICD-10-CM | POA: Insufficient documentation

## 2016-04-17 DIAGNOSIS — R300 Dysuria: Secondary | ICD-10-CM | POA: Insufficient documentation

## 2016-04-17 DIAGNOSIS — Z8249 Family history of ischemic heart disease and other diseases of the circulatory system: Secondary | ICD-10-CM | POA: Diagnosis not present

## 2016-04-17 DIAGNOSIS — R197 Diarrhea, unspecified: Secondary | ICD-10-CM | POA: Insufficient documentation

## 2016-04-17 DIAGNOSIS — Z79899 Other long term (current) drug therapy: Secondary | ICD-10-CM | POA: Diagnosis not present

## 2016-04-17 DIAGNOSIS — N898 Other specified noninflammatory disorders of vagina: Secondary | ICD-10-CM | POA: Diagnosis not present

## 2016-04-17 LAB — POCT URINALYSIS DIP (DEVICE)
BILIRUBIN URINE: NEGATIVE
GLUCOSE, UA: NEGATIVE mg/dL
Hgb urine dipstick: NEGATIVE
KETONES UR: NEGATIVE mg/dL
Nitrite: NEGATIVE
PH: 7 (ref 5.0–8.0)
Protein, ur: NEGATIVE mg/dL
Specific Gravity, Urine: 1.02 (ref 1.005–1.030)
Urobilinogen, UA: 0.2 mg/dL (ref 0.0–1.0)

## 2016-04-17 LAB — POCT PREGNANCY, URINE: PREG TEST UR: NEGATIVE

## 2016-04-17 MED ORDER — METRONIDAZOLE 500 MG PO TABS: 500.0000 mg | ORAL_TABLET | Freq: Two times a day (BID) | ORAL | 0 refills | Status: DC

## 2016-04-17 NOTE — ED Triage Notes (Signed)
Also c/o dysuria and polyuria.

## 2016-04-17 NOTE — ED Triage Notes (Signed)
c/O constant low abd pain x 2 days with "little bit" of diarrhea.  + nausea, no vomiting.  Denies fevers.

## 2016-04-17 NOTE — ED Provider Notes (Signed)
CSN: [id number]     Arrival date & time 04/17/16  1203 History   First MD Initiated Contact with Patient 04/17/16 1240     Chief Complaint  Patient presents with  . Abdominal Pain   (Consider location/radiation/quality/duration/timing/severity/associated sxs/prior Treatment) 26 y.o. female presents with lower abdominal pain  X 2 days with dysuria, polyuria, diarrhea patient denies fevers, nausea, vomiting or fevers. Patient reports vaginal discharge taht she describes ans "smelling funny and yelowish green in nature" Patient has no sick contacts Condition is acute in nature. Condition is made better by nothing. Condition is made worse by nothing. Patient denies any treatment prior to there arrival at this facility.        Past Medical History:  Diagnosis Date  . Medical history non-contributory    Past Surgical History:  Procedure Laterality Date  . TONSILLECTOMY     Family History  Problem Relation Age of Onset  . Hypertension Mother   . Hypertension Father    Social History  Substance Use Topics  . Smoking status: Never Smoker  . Smokeless tobacco: Never Used  . Alcohol use No   OB History    Gravida Para Term Preterm AB Living   SAB TAB Ectopic Multiple Live Births           1     Review of Systems  Constitutional: Negative for chills and fever.  HENT: Negative for ear pain and sore throat.   Eyes: Negative for pain and visual disturbance.  Respiratory: Negative for cough and shortness of breath.   Cardiovascular: Negative for chest pain and palpitations.  Gastrointestinal: Positive for abdominal pain and diarrhea. Negative for vomiting.  Genitourinary: Positive for dysuria, frequency and pelvic pain. Negative for hematuria.  Musculoskeletal: Negative for arthralgias and back pain.  Skin: Negative for color change and rash.  Neurological: Negative for seizures and syncope.  All other systems reviewed and are negative.   Allergies  Patient has  no known allergies.  Home Medications   Prior to Admission medications   Medication Sig Start Date End Date Taking? Authorizing Provider  citalopram (CELEXA) 40 MG tablet Take 1 tablet (40 mg total) by mouth daily. 09/29/15  Yes Marquette Saa, MD  butalbital-acetaminophen-caffeine (FIORICET, [facility]) [phone] MG tablet Take 1-2 tablets by mouth every 6 (six) hours as needed for headache. 12/17/15 12/16/16  Elvina Sidle, MD  MedroxyPROGESTERone Acetate (DEPO-PROVERA IM) Inject into the muscle.    Historical Provider, MD  metroNIDAZOLE (FLAGYL) 500 MG tablet Take 1 tablet (500 mg total) by mouth 2 (two) times daily. 04/17/16   Alene Mires, NP  Prenatal Vit-Fe Fumarate-FA (PRENATAL VITAMINS) 28-0.8 MG TABS Take 1 tablet by mouth daily. 08/16/13   Latrelle Dodrill, MD   Meds Ordered and Administered this Visit  Medications - No data to display  BP 125/75   Pulse 76   Temp 98.2 F (36.8 C) (Oral)   Resp 16   SpO2 99%   Breastfeeding? No  No data found.   Physical Exam  Constitutional: She appears well-developed and well-nourished. No distress.  HENT:  Head: Normocephalic and atraumatic.  Eyes: Conjunctivae are normal.  Neck: Neck supple.  Cardiovascular: Normal rate, regular rhythm and normal heart sounds.   No murmur heard. Pulmonary/Chest: Effort normal and breath sounds normal. No respiratory distress.  Abdominal: Soft. Bowel sounds are normal. There is tenderness ( lower abd and suprapubic).  Musculoskeletal: She exhibits no edema.  Neurological: She is alert.  Skin: Skin is warm and dry.  Psychiatric: She has a normal mood and affect.  Nursing note and vitals reviewed.   Urgent Care Course     Procedures (including critical care time)  Labs Review Labs Reviewed  POCT URINALYSIS DIP (DEVICE) - Abnormal; Notable for the following:       Result Value   Leukocytes, UA TRACE (*)    All other components within normal limits  POCT PREGNANCY, URINE   CERVICOVAGINAL ANCILLARY ONLY    Imaging Review No results found.  Pelvic exam performed with chaperon presents. Samples obtained and sent to lab. Vaginal discharge noted during exam    MDM   1. Lower abdominal pain       Alene Mires, NP 04/17/16 1356

## 2016-04-19 LAB — CERVICOVAGINAL ANCILLARY ONLY
BACTERIAL VAGINITIS: NEGATIVE
Candida vaginitis: POSITIVE — AB
Chlamydia: NEGATIVE
NEISSERIA GONORRHEA: NEGATIVE
Trichomonas: NEGATIVE

## 2016-04-20 ENCOUNTER — Telehealth (HOSPITAL_COMMUNITY): Payer: Self-pay | Admitting: Internal Medicine

## 2016-04-20 MED ORDER — FLUCONAZOLE 150 MG PO TABS: 150.0000 mg | ORAL_TABLET | Freq: Once | ORAL | 0 refills | Status: AC

## 2016-04-20 NOTE — Telephone Encounter (Signed)
Clinical staff, please let patient know that test for candida (yeast) was positive.  Rx fluconazole was sent to the pharmacy of record, [facility] on [address] at [employer].  Recheck or followup with primary care provider for further evaluation if symptoms are not improving.  LM

## 2016-10-11 ENCOUNTER — Ambulatory Visit (INDEPENDENT_AMBULATORY_CARE_PROVIDER_SITE_OTHER): Payer: Self-pay | Admitting: *Deleted

## 2016-10-11 DIAGNOSIS — Z23 Encounter for immunization: Secondary | ICD-10-CM

## 2016-10-11 NOTE — Progress Notes (Signed)
Pt came in for flu shot. Tolerated it well. Deseree Bruna Potter, CMA

## 2016-11-19 ENCOUNTER — Ambulatory Visit (HOSPITAL_COMMUNITY)
Admission: EM | Admit: 2016-11-19 | Discharge: 2016-11-19 | Disposition: A | Discharge status: Home / Self Care | Payer: Medicaid Other

## 2016-11-19 ENCOUNTER — Encounter (HOSPITAL_COMMUNITY): Payer: Self-pay | Admitting: Emergency Medicine

## 2016-11-19 ENCOUNTER — Ambulatory Visit (HOSPITAL_COMMUNITY)
Admission: EM | Admit: 2016-11-19 | Discharge: 2016-11-19 | Disposition: A | Discharge status: Home / Self Care | Payer: Medicaid Other | Attending: Internal Medicine | Admitting: Internal Medicine

## 2016-11-19 DIAGNOSIS — R350 Frequency of micturition: Secondary | ICD-10-CM

## 2016-11-19 DIAGNOSIS — R109 Unspecified abdominal pain: Secondary | ICD-10-CM

## 2016-11-19 DIAGNOSIS — R110 Nausea: Secondary | ICD-10-CM | POA: Diagnosis not present

## 2016-11-19 DIAGNOSIS — Z3202 Encounter for pregnancy test, result negative: Secondary | ICD-10-CM | POA: Diagnosis not present

## 2016-11-19 DIAGNOSIS — N390 Urinary tract infection, site not specified: Secondary | ICD-10-CM | POA: Diagnosis present

## 2016-11-19 LAB — POCT URINALYSIS DIP (DEVICE)
BILIRUBIN URINE: NEGATIVE
Glucose, UA: NEGATIVE mg/dL
HGB URINE DIPSTICK: NEGATIVE
KETONES UR: NEGATIVE mg/dL
LEUKOCYTES UA: NEGATIVE
Nitrite: NEGATIVE
PH: 6.5 (ref 5.0–8.0)
Protein, ur: NEGATIVE mg/dL
Specific Gravity, Urine: 1.025 (ref 1.005–1.030)
Urobilinogen, UA: 0.2 mg/dL (ref 0.0–1.0)

## 2016-11-19 LAB — POCT PREGNANCY, URINE: PREG TEST UR: NEGATIVE

## 2016-11-19 MED ORDER — CEPHALEXIN 500 MG PO CAPS: 500.0000 mg | ORAL_CAPSULE | Freq: Two times a day (BID) | ORAL | 0 refills | Status: AC

## 2016-11-19 NOTE — ED Triage Notes (Signed)
PT C/O: UTI sx .... LMP = 10/10/16  ONSET: 1 week   SX INCLUDE: lower abd pain, urinary freq/urgency, nauseas  DENIES: fevers, chills, hematuria, vomiting  TAKING MEDS: acetaminophen and advil   A&O x4... NAD... Ambulatory

## 2016-11-19 NOTE — ED Provider Notes (Addendum)
[facility]    CSN: [id number] Arrival date & time: 11/19/16  1307     History   Chief Complaint Chief Complaint  Patient presents with  . Urinary Tract Infection    HPI Amber Hubbard is a 26 y.o. female.   Presenting today for 1-week duration of  lower abdominal pain, urinary frequency, back pain, nausea, and incomplete emptying of her bladder. She has no dysuria. She believes she might be pregnant as she missed her period, stating her menstrual cycle is usually very regular and comes on time. Her LMP is 10/06. She is not on any forms of contraception. She is afebrile. She has no vaginal discharge.    Past Medical History:  Diagnosis Date  . Medical history non-contributory     Patient Active Problem List   Diagnosis Date Noted  . Amenorrhea 11/20/2015  . Rectal bleeding 06/12/2015  . 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 Depression 06/23/2014  . Anal fissure 10/20/2012  . Contraception management 08/16/2012  . Night sweats 03/26/2011  . PTSD (post-traumatic stress disorder) 02/15/2010  . Dysmenorrhea 02/13/2010    Past Surgical History:  Procedure Laterality Date  . TONSILLECTOMY      OB History    Gravida Para Term Preterm AB Living   1 1 1     1    SAB TAB Ectopic Multiple Live Births           1       Home Medications    Prior to Admission medications   Medication Sig Start Date End Date Taking? Authorizing Provider  citalopram (CELEXA) 40 MG tablet Take 1 tablet (40 mg total) by mouth daily. 09/29/15  Yes Marquette SaaLancaster, Abigail Joseph, MD  butalbital-acetaminophen-caffeine (FIORICET, [facility]) [phone] MG tablet Take 1-2 tablets by mouth every 6 (six) hours as needed for headache. 12/17/15 12/16/16  Elvina SidleLauenstein, Kurt, MD  MedroxyPROGESTERone Acetate (DEPO-PROVERA IM) Inject into the muscle.    [provider]  metroNIDAZOLE (FLAGYL) 500 MG tablet Take 1 tablet (500 mg total) by mouth 2 (two) times daily. 04/17/16   Alene Miresmohundro,  Jennifer C, NP  Prenatal Vit-Fe Fumarate-FA (PRENATAL VITAMINS) 28-0.8 MG TABS Take 1 tablet by mouth daily. 08/16/13   Latrelle DodrillMcIntyre, Brittany J, MD    Family History Family History  Problem Relation Age of Onset  . Hypertension Mother   . Hypertension Father     Social History Social History   Tobacco Use  . Smoking status: Never Smoker  . Smokeless tobacco: Never Used  Substance Use Topics  . Alcohol use: No  . Drug use: No     Allergies   Patient has no known allergies.   Review of Systems Review of Systems  Constitutional:       See HPI     Physical Exam Triage Vital Signs ED Triage Vitals  Enc Vitals Group     BP 11/19/16 1329 104/63     Pulse Rate 11/19/16 1329 66     Resp 11/19/16 1329 20     Temp 11/19/16 1329 98.2 F (36.8 C)     Temp Source 11/19/16 1329 Oral     SpO2 11/19/16 1329 100 %     Weight --      Height --      Head Circumference --      Peak Flow --      Pain Score 11/19/16 1330 3     Pain Loc --      Pain Edu? --  Excl. in GC? --    No data found.  Updated Vital Signs BP 104/63 (BP Location: Left Arm)   Pulse 66   Temp 98.2 F (36.8 C) (Oral)   Resp 20   LMP 10/10/2016   SpO2 100%   Physical Exam  Constitutional: She is oriented to person, place, and time. She appears well-developed and well-nourished. No distress.  HENT:  Head: Normocephalic and atraumatic.  Cardiovascular: Normal rate, regular rhythm and normal heart sounds.  No murmur heard. Pulmonary/Chest: Effort normal and breath sounds normal. She has no wheezes.  Abdominal: Soft. Bowel sounds are normal. There is tenderness (Suprapubic area).  Genitourinary:  Genitourinary Comments: Negative CVA tenderness  Neurological: She is alert and oriented to person, place, and time.  Skin: Skin is warm and dry. She is not diaphoretic.  Nursing note and vitals reviewed.    UC Treatments / Results  Labs (all labs ordered are listed, but only abnormal results are  displayed) Labs Reviewed  URINE CULTURE  HCG, QUANTITATIVE, PREGNANCY  POCT URINALYSIS DIP (DEVICE)  POCT PREGNANCY, URINE    EKG  EKG Interpretation None       Radiology No results found.  Procedures Procedures (including critical care time)  Medications Ordered in UC Medications - No data to display   Initial Impression / Assessment and Plan / UC Course  I have reviewed the triage vital signs and the nursing notes.  Pertinent labs & imaging results that were available during my care of the patient were reviewed by me and considered in my medical decision making (see chart for details).  Final Clinical Impressions(s) / UC Diagnoses   Final diagnoses:  Abdominal pain, unspecified abdominal location  Nausea without vomiting  Urinary frequency   UA preg and Urinalysis both negative. However her symptoms are consistent with UTI, will send urine off for culture. Will send home on Keflex to treat empirically.   Will also order a HCG quantitative.   Addendum: Patient declined HCG quantitative. She said she will repeat a home pregnancy test in 1 week.   ED Discharge Orders    None     Controlled Substance Prescriptions [place] Controlled Substance Registry consulted? Not Applicable   Lucia EstelleZheng, Kaycee Haycraft, NP 11/19/16 1436    Lucia EstelleZheng, Claudean Leavelle, NP 11/19/16 1455

## 2016-11-20 LAB — URINE CULTURE

## 2016-11-24 ENCOUNTER — Ambulatory Visit: Payer: Medicaid Other | Admitting: Internal Medicine

## 2016-11-24 ENCOUNTER — Encounter: Payer: Self-pay | Admitting: Internal Medicine

## 2016-11-24 ENCOUNTER — Other Ambulatory Visit: Payer: Self-pay

## 2016-11-24 VITALS — BP 120/70 | HR 70 | Temp 98.1°F | Ht 59.0 in | Wt 143.2 lb

## 2016-11-24 DIAGNOSIS — N912 Amenorrhea, unspecified: Secondary | ICD-10-CM

## 2016-11-24 DIAGNOSIS — N926 Irregular menstruation, unspecified: Secondary | ICD-10-CM

## 2016-11-24 LAB — POCT URINE PREGNANCY: PREG TEST UR: NEGATIVE

## 2016-11-24 NOTE — Progress Notes (Signed)
   Subjective:   Patient: Amber Hubbard       Birthdate: 06/27/1990       MRN: [id number]      HPI  Amber Hubbard is a 26 y.o. female presenting for concern for pregnancy.   Patient was seen at Urgent Care on 11/16 for this issue. LMP was 10/6 and she was concerned she may be pregnant as she had not started her period yet. Upreg negative, patient declined quantitative HCG. UA did not show signs of infection, however as patient's symptoms were consistent with UTI, she was prescribed course of Keflex. Patient was encouraged to take repeat home pregnancy test one week later to confirm no pregnancy.   Patient presents today requesting quantitative HCG. Says she did not want this at Urgent Care because she did not have time to stay for the blood draw. Has not taken another urine pregnancy test at home. Still has not started her period. Still endorsing increased urinary frequency and some nausea. Denies fevers, chills, abdominal pain, hematuria. Did not pick up Keflex from pharmacy so has not started yet.   Smoking status reviewed. Patient is never smoker.   Review of Systems See HPI.     Objective:  Physical Exam  Constitutional: She is oriented to person, place, and time and well-developed, well-nourished, and in no distress.  HENT:  Head: Normocephalic and atraumatic.  Eyes: Conjunctivae and EOM are normal. Right eye exhibits no discharge. Left eye exhibits no discharge.  Cardiovascular: Normal rate.  Pulmonary/Chest: Effort normal. No respiratory distress.  Neurological: She is alert and oriented to person, place, and time.  Skin: Skin is warm and dry.  Psychiatric: Affect and judgment normal.      Assessment & Plan:  Amenorrhea Upreg negative. Now with two neg upreg in past five days. Quant HCG not indicated. Discussed this with patient. Also encouraged patient to pick up Keflex today to treat possible UTI, as she is still reporting increased frequency and nausea. Discussed signs of worsening  infection and return precautions. Patient said she will pick up and begin Keflex today.   Tarri AbernethyAbigail J Zoei Amison, MD, MPH PGY-3 [facility] Pager [phone]

## 2016-11-24 NOTE — Assessment & Plan Note (Signed)
Upreg negative. Now with two neg upreg in past five days. Quant HCG not indicated. Discussed this with patient. Also encouraged patient to pick up Keflex today to treat possible UTI, as she is still reporting increased frequency and nausea. Discussed signs of worsening infection and return precautions. Patient said she will pick up and begin Keflex today.

## 2016-11-24 NOTE — Patient Instructions (Addendum)
It was nice meeting you today Amber Hubbard!  Your pregnancy test was negative today. Since you have now had two negative pregnancy tests, you do not need to take another one.   Please pick up the antibiotics (Keflex) today and take all the pills until they are gone. If you start to have fevers or chills, abdominal pain, or vomiting, please call the clinic or go to urgent care.   If you have any questions or concerns, please feel free to call the clinic.   Be well,  Dr. Natale MilchLancaster

## 2016-12-08 ENCOUNTER — Other Ambulatory Visit: Payer: Self-pay | Admitting: *Deleted

## 2016-12-08 DIAGNOSIS — F32A Depression, unspecified: Secondary | ICD-10-CM

## 2016-12-08 DIAGNOSIS — F329 Major depressive disorder, single episode, unspecified: Secondary | ICD-10-CM

## 2016-12-08 DIAGNOSIS — F4310 Post-traumatic stress disorder, unspecified: Secondary | ICD-10-CM

## 2016-12-08 MED ORDER — CITALOPRAM HYDROBROMIDE 40 MG PO TABS: 40.0000 mg | ORAL_TABLET | Freq: Every day | ORAL | 3 refills | Status: DC

## 2016-12-08 NOTE — Telephone Encounter (Signed)
[place] leaving country tomorrow AM. [facility] [address] Kinnie FeilL. Linday Rhodes, RN, BSN

## 2017-01-21 ENCOUNTER — Emergency Department (HOSPITAL_COMMUNITY): Payer: Medicaid Other

## 2017-01-21 ENCOUNTER — Encounter (HOSPITAL_COMMUNITY): Payer: Self-pay | Admitting: *Deleted

## 2017-01-21 ENCOUNTER — Encounter (HOSPITAL_COMMUNITY): Payer: Self-pay | Admitting: Emergency Medicine

## 2017-01-21 ENCOUNTER — Ambulatory Visit (HOSPITAL_COMMUNITY)
Admission: EM | Admit: 2017-01-21 | Discharge: 2017-01-21 | Disposition: A | Discharge status: Home / Self Care | Payer: Medicaid Other | Attending: Emergency Medicine | Admitting: Emergency Medicine

## 2017-01-21 ENCOUNTER — Ambulatory Visit (INDEPENDENT_AMBULATORY_CARE_PROVIDER_SITE_OTHER): Payer: Medicaid Other

## 2017-01-21 ENCOUNTER — Other Ambulatory Visit: Payer: Self-pay

## 2017-01-21 ENCOUNTER — Emergency Department (HOSPITAL_COMMUNITY)
Admission: EM | Admit: 2017-01-21 | Discharge: 2017-01-21 | Disposition: A | Discharge status: Home / Self Care | Payer: Medicaid Other | Attending: Emergency Medicine | Admitting: Emergency Medicine

## 2017-01-21 DIAGNOSIS — R1084 Generalized abdominal pain: Secondary | ICD-10-CM

## 2017-01-21 DIAGNOSIS — Z79899 Other long term (current) drug therapy: Secondary | ICD-10-CM | POA: Insufficient documentation

## 2017-01-21 DIAGNOSIS — R140 Abdominal distension (gaseous): Secondary | ICD-10-CM

## 2017-01-21 DIAGNOSIS — R109 Unspecified abdominal pain: Secondary | ICD-10-CM

## 2017-01-21 LAB — POCT URINALYSIS DIP (DEVICE)
BILIRUBIN URINE: NEGATIVE
GLUCOSE, UA: NEGATIVE mg/dL
HGB URINE DIPSTICK: NEGATIVE
Ketones, ur: NEGATIVE mg/dL
LEUKOCYTES UA: NEGATIVE
NITRITE: NEGATIVE
Protein, ur: NEGATIVE mg/dL
Urobilinogen, UA: 0.2 mg/dL (ref 0.0–1.0)
pH: 5.5 (ref 5.0–8.0)

## 2017-01-21 LAB — COMPREHENSIVE METABOLIC PANEL
ALBUMIN: 4.7 g/dL (ref 3.5–5.0)
ALK PHOS: 69 U/L (ref 38–126)
ALT: 16 U/L (ref 14–54)
ANION GAP: 13 (ref 5–15)
AST: 28 U/L (ref 15–41)
BILIRUBIN TOTAL: 0.6 mg/dL (ref 0.3–1.2)
BUN: 14 mg/dL (ref 6–20)
CALCIUM: 10.2 mg/dL (ref 8.9–10.3)
CO2: 25 mmol/L (ref 22–32)
Chloride: 100 mmol/L — ABNORMAL LOW (ref 101–111)
Creatinine, Ser: 0.76 mg/dL (ref 0.44–1.00)
GFR calc Af Amer: >60 mL/min (ref >60)
GFR calc non Af Amer: >60 mL/min (ref >60)
GLUCOSE: 87 mg/dL (ref 65–99)
POTASSIUM: 4.2 mmol/L (ref 3.5–5.1)
SODIUM: 138 mmol/L (ref 135–145)
TOTAL PROTEIN: 9 g/dL — AB (ref 6.5–8.1)

## 2017-01-21 LAB — I-STAT BETA HCG BLOOD, ED (MC, WL, AP ONLY)

## 2017-01-21 LAB — URINALYSIS, ROUTINE W REFLEX MICROSCOPIC
Bilirubin Urine: NEGATIVE
GLUCOSE, UA: NEGATIVE mg/dL
Hgb urine dipstick: NEGATIVE
Ketones, ur: NEGATIVE mg/dL
Leukocytes, UA: NEGATIVE
Nitrite: NEGATIVE
Protein, ur: NEGATIVE mg/dL
SPECIFIC GRAVITY, URINE: 1.021 (ref 1.005–1.030)
pH: 5 (ref 5.0–8.0)

## 2017-01-21 LAB — CBC
HEMATOCRIT: 42.3 % (ref 36.0–46.0)
HEMOGLOBIN: 14.4 g/dL (ref 12.0–15.0)
MCH: 25.8 pg — AB (ref 26.0–34.0)
MCHC: 34 g/dL (ref 30.0–36.0)
MCV: 75.8 fL — ABNORMAL LOW (ref 78.0–100.0)
Platelets: 326 10*3/uL (ref 150–400)
RBC: 5.58 MIL/uL — ABNORMAL HIGH (ref 3.87–5.11)
RDW: 14.3 % (ref 11.5–15.5)
WBC: 10.6 10*3/uL — ABNORMAL HIGH (ref 4.0–10.5)

## 2017-01-21 LAB — POCT PREGNANCY, URINE: Preg Test, Ur: NEGATIVE

## 2017-01-21 LAB — LIPASE, BLOOD: Lipase: 28 U/L (ref 11–51)

## 2017-01-21 MED ORDER — RANITIDINE HCL 150 MG PO TABS: 150.0000 mg | ORAL_TABLET | Freq: Two times a day (BID) | ORAL | 0 refills | Status: DC

## 2017-01-21 MED ORDER — IOPAMIDOL (ISOVUE-300) INJECTION 61%
INTRAVENOUS | Status: AC
  Administered 2017-01-21: 100 mL
  Filled 2017-01-21: qty 100

## 2017-01-21 MED ORDER — GI COCKTAIL ~~LOC~~
ORAL | Status: AC
Start: 2017-01-21 — End: ?
  Filled 2017-01-21: qty 30

## 2017-01-21 MED ORDER — ONDANSETRON HCL 4 MG/2ML IJ SOLN
4.0000 mg | Freq: Once | INTRAMUSCULAR | Status: AC
  Administered 2017-01-21: 4 mg via INTRAVENOUS
  Filled 2017-01-21: qty 2

## 2017-01-21 MED ORDER — GI COCKTAIL ~~LOC~~: 30.0000 mL | Freq: Once | ORAL | Status: DC

## 2017-01-21 MED ORDER — SODIUM CHLORIDE 0.9 % IV BOLUS (SEPSIS)
1000.0000 mL | Freq: Once | INTRAVENOUS | Status: AC
  Administered 2017-01-21: 1000 mL via INTRAVENOUS

## 2017-01-21 MED ORDER — MORPHINE SULFATE (PF) 4 MG/ML IV SOLN
4.0000 mg | Freq: Once | INTRAVENOUS | Status: AC
  Administered 2017-01-21: 4 mg via INTRAVENOUS
  Filled 2017-01-21: qty 1

## 2017-01-21 MED ORDER — DICYCLOMINE HCL 20 MG PO TABS: 20.0000 mg | ORAL_TABLET | Freq: Three times a day (TID) | ORAL | 0 refills | Status: DC

## 2017-01-21 MED ORDER — DICYCLOMINE HCL 10 MG PO CAPS
20.0000 mg | ORAL_CAPSULE | Freq: Once | ORAL | Status: AC
Start: 2017-01-21 — End: 2017-01-21
  Administered 2017-01-21: 20 mg via ORAL
  Filled 2017-01-21: qty 2

## 2017-01-21 MED ORDER — ONDANSETRON 4 MG PO TBDP: 4.0000 mg | ORAL_TABLET | Freq: Three times a day (TID) | ORAL | 0 refills | Status: AC | PRN

## 2017-01-21 NOTE — ED Notes (Signed)
Pt discharged from ED; instructions provided and scripts given; Pt encouraged to return to ED if symptoms worsen and to f/u with PCP; Pt verbalized understanding of all instructions 

## 2017-01-21 NOTE — ED Triage Notes (Signed)
Pt sent here from ucc. Reports mid abd pain and cramping for over a week. Denies n/v/d, denies urinary or vaginal symptoms. Reports just having the abd pain and a headache.

## 2017-01-21 NOTE — ED Provider Notes (Signed)
[facility] Provider Note   CSN: [id number] Arrival date & time: 01/21/17  1515     History   Chief Complaint Chief Complaint  Patient presents with  . Abdominal Pain    HPI Amber Hubbard is a 27 y.o. female.  HPI  27 year old female here with abdominal pain.  The patient states that she recently returned from [place].  Since then, she is had intermittent aching, cramping, diffuse abdominal pain.  There is no specific area of increased pain.  She has not had any associated nausea, vomiting, or diarrhea.  She states she did eat multiple foods during the trip, but as mentioned has had no diarrhea.  She said no fevers or chills.  The pain seems to come and go, worsens with stress as well as with eating.  No alleviating factors.  She went to urgent care and was sent here for further evaluation.  No weight loss or night sweats.  No chills.  Past Medical History:  Diagnosis Date  . Medical history non-contributory     Patient Active Problem List   Diagnosis Date Noted  . Amenorrhea 11/20/2015  . Rectal bleeding 06/12/2015  . 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 Depression 06/23/2014  . Anal fissure 10/20/2012  . Contraception management 08/16/2012  . Night sweats 03/26/2011  . PTSD (post-traumatic stress disorder) 02/15/2010  . Dysmenorrhea 02/13/2010    Past Surgical History:  Procedure Laterality Date  . TONSILLECTOMY      OB History    Gravida Para Term Preterm AB Living   1 1 1     1    SAB TAB Ectopic Multiple Live Births           1       Home Medications    Prior to Admission medications   Medication Sig Start Date End Date Taking? Authorizing Provider  acetaminophen (TYLENOL) 325 MG tablet Take 650 mg by mouth every 6 (six) hours as needed for headache (pain).   Yes [provider]  citalopram (CELEXA) 40 MG tablet Take 1 tablet (40 mg total) by mouth daily. Patient taking differently: Take 40 mg by  mouth at bedtime.  12/08/16  Yes Marquette SaaLancaster, Abigail Joseph, MD  dicyclomine (BENTYL) 20 MG tablet Take 1 tablet (20 mg total) by mouth 4 (four) times daily -  before meals and at bedtime for 10 days. 01/21/17 01/31/17  Shaune PollackIsaacs, Keriana Sarsfield, MD  ondansetron (ZOFRAN ODT) 4 MG disintegrating tablet Take 1 tablet (4 mg total) by mouth every 8 (eight) hours as needed for nausea or vomiting. 01/21/17   Shaune PollackIsaacs, Gisell Buehrle, MD  Prenatal Vit-Fe Fumarate-FA (PRENATAL VITAMINS) 28-0.8 MG TABS Take 1 tablet by mouth daily. Patient not taking: Reported on 01/21/2017 08/16/13   Latrelle DodrillMcIntyre, Brittany J, MD  ranitidine (ZANTAC) 150 MG tablet Take 1 tablet (150 mg total) by mouth 2 (two) times daily for 10 days. 01/21/17 01/31/17  Shaune PollackIsaacs, Jiayi Lengacher, MD    Family History Family History  Problem Relation Age of Onset  . Hypertension Mother   . Hypertension Father     Social History Social History   Tobacco Use  . Smoking status: Never Smoker  . Smokeless tobacco: Never Used  Substance Use Topics  . Alcohol use: No  . Drug use: No     Allergies   Patient has no known allergies.   Review of Systems Review of Systems  Constitutional: Positive for fatigue.  Gastrointestinal: Positive for abdominal pain.  All other  systems reviewed and are negative.    Physical Exam Updated Vital Signs BP 126/66 (BP Location: Right Arm)   Pulse 73   Temp 98 F (36.7 C) (Oral)   Resp 16   LMP 01/14/2017   SpO2 99%   Physical Exam  Constitutional: She is oriented to person, place, and time. She appears well-developed and well-nourished. No distress.  HENT:  Head: Normocephalic and atraumatic.  Eyes: Conjunctivae are normal.  Neck: Neck supple.  Cardiovascular: Normal rate, regular rhythm and normal heart sounds. Exam reveals no friction rub.  No murmur heard. Pulmonary/Chest: Effort normal and breath sounds normal. No respiratory distress. She has no wheezes. She has no rales.  Abdominal: She exhibits no distension.    Mild diffuse tenderness to palpation.  No rebound or guarding.  Musculoskeletal: She exhibits no edema.  Neurological: She is alert and oriented to person, place, and time. She exhibits normal muscle tone.  Skin: Skin is warm. Capillary refill takes less than 2 seconds.  Psychiatric: She has a normal mood and affect.  Nursing note and vitals reviewed.    ED Treatments / Results  Labs (all labs ordered are listed, but only abnormal results are displayed) Labs Reviewed  COMPREHENSIVE METABOLIC PANEL - Abnormal; Notable for the following components:      Result Value   Chloride 100 (*)    Total Protein 9.0 (*)    All other components within normal limits  CBC - Abnormal; Notable for the following components:   WBC 10.6 (*)    RBC 5.58 (*)    MCV 75.8 (*)    MCH 25.8 (*)    All other components within normal limits  LIPASE, BLOOD  URINALYSIS, ROUTINE W REFLEX MICROSCOPIC  I-STAT BETA HCG BLOOD, ED ([facility], [facility], [facility] ONLY)    EKG  EKG Interpretation None       Radiology Ct Abdomen Pelvis W Contrast  Result Date: 01/21/2017 CLINICAL DATA:  Abdominal distention.  Nausea and vomiting. EXAM: CT ABDOMEN AND PELVIS WITH CONTRAST TECHNIQUE: Multidetector CT imaging of the abdomen and pelvis was performed using the standard protocol following bolus administration of intravenous contrast. CONTRAST:  ISOVUE-300 IOPAMIDOL (ISOVUE-300) INJECTION 61% COMPARISON:  None. FINDINGS: Lower chest: No acute abnormality. Hepatobiliary: No focal liver abnormality is seen. No gallstones, gallbladder wall thickening, or biliary dilatation. Pancreas: Unremarkable. No pancreatic ductal dilatation or surrounding inflammatory changes. Spleen: Normal in size without focal abnormality. Adrenals/Urinary Tract: Adrenal glands are unremarkable. Kidneys are normal, without renal calculi, focal lesion, or hydronephrosis. Bladder is unremarkable. Stomach/Bowel: Stomach is within normal limits. Appendix appears  normal. No evidence of bowel wall thickening, distention, or inflammatory changes. Vascular/Lymphatic: No significant vascular findings are present. No enlarged abdominal or pelvic lymph nodes. Reproductive: Uterus and bilateral adnexa are unremarkable. Other: No abdominal wall hernia or abnormality. No abdominopelvic ascites. Musculoskeletal: No acute or significant osseous findings. IMPRESSION: 1. No acute findings within the abdomen or pelvis.  Normal exam. Electronically Signed   By: Signa Kell M.D.   On: 01/21/2017 23:27   Dg Abd 2 Views  Result Date: 01/21/2017 CLINICAL DATA:  Abdominal pain and bloating EXAM: ABDOMEN - 2 VIEW COMPARISON:  None. FINDINGS: The bowel gas pattern is normal. There is no evidence of free air. No radio-opaque calculi or other significant radiographic abnormality is seen. Mild lumbar scoliosis. Mild lumbar scoliosis. IMPRESSION: Negative. Electronically Signed   By: Marlan Palau M.D.   On: 01/21/2017 15:12    Procedures Procedures (including critical care  time)  Medications Ordered in ED Medications  sodium chloride 0.9 % bolus 1,000 mL (0 mLs Intravenous Stopped 01/21/17 2307)  ondansetron (ZOFRAN) injection 4 mg (4 mg Intravenous Given 01/21/17 2208)  dicyclomine (BENTYL) capsule 20 mg (20 mg Oral Given 01/21/17 2209)  morphine 4 MG/ML injection 4 mg (4 mg Intravenous Given 01/21/17 2209)  iopamidol (ISOVUE-300) 61 % injection (100 mLs  Contrast Given 01/21/17 2251)     Initial Impression / Assessment and Plan / ED Course  I have reviewed the triage vital signs and the nursing notes.  Pertinent labs & imaging results that were available during my care of the patient were reviewed by me and considered in my medical decision making (see chart for details).     27 year old female here with diffuse abdominal cramping in the setting of recent travel to [place].  However, this was over 1 month ago, and she has no signs of diarrhea, vomiting, or ongoing  symptoms to suggest traveler's diarrhea.  She has no anemia and no other symptoms to suggest malaria or other mosquito borne illness.  Her exam does show diffuse tenderness to CT obtained and is unremarkable.  She feels improved with Bentyl and symptomatic treatment here.  There may be a component of IBS, or gastritis.  Will treat as such.  No other apparent acute emergent pathology.  She has no urinary symptoms.  UPT is negative.  Discharge with good return precautions.  This note was prepared with assistance of Conservation officer, historic buildings. Occasional wrong-word or sound-a-like substitutions may have occurred due to the inherent limitations of voice recognition software.   Final Clinical Impressions(s) / ED Diagnoses   Final diagnoses:  Abdominal cramping    ED Discharge Orders        Ordered    dicyclomine (BENTYL) 20 MG tablet  3 times daily before meals & bedtime     01/21/17 2341    ondansetron (ZOFRAN ODT) 4 MG disintegrating tablet  Every 8 hours PRN     01/21/17 2341    ranitidine (ZANTAC) 150 MG tablet  2 times daily     01/21/17 2341       Shaune Pollack, MD 01/22/17 [phone]

## 2017-01-21 NOTE — ED Triage Notes (Signed)
Pt c/o stomach cramping x1 week, constant. Denies n/v/d. Denies issues urinating.

## 2017-01-21 NOTE — Discharge Instructions (Signed)
I highly recommend that you start taking an over-the-counter probiotic.  Any probiotic should be sufficient, including generics.  These can be purchased at any pharmacy, [facility], [employer], or other large store.  Try taking the probiotic at least once a day for the next 10-14 days.

## 2017-01-21 NOTE — Discharge Instructions (Addendum)
Because of your abdominal pain on exam and severity of symptoms I am concerned you may need additional abdominal evaluation and possibly additional imaging.  I recommend that you go to ER for complete evaluation

## 2017-01-21 NOTE — ED Triage Notes (Signed)
Pt states she was in [place] x1 month and thinks she might have eaten something bad there.

## 2017-01-21 NOTE — ED Notes (Signed)
Patient returned from CT

## 2017-01-21 NOTE — ED Provider Notes (Signed)
[facility]    CSN: [id number] Arrival date & time: 01/21/17  1239     History   Chief Complaint Chief Complaint  Patient presents with  . Abdominal Cramping    HPI Amber Hubbard is a 27 y.o. female.   Amber Hubbard presents with complaints of abdominal pain which has been worsening since approximately 1/9. She feels it is cramping in nature to her entire abdomen, it has been worsening. Rates pain 7/10. Tylenol has mildly helped with pain. She returned from [place] at time of onset of symptoms. She was visiting family there for approximately 1 month. She states she did have intermittent diarrhea while she was there but now has had daily regular soft/formed stools. Denies urinary symptoms. States has some increased vaginal discharge. Without itching or burning, denies vaginal bleeding. She has had irregular periods, went two months without a period and then bled last week. No longer on period. Sexually active with her partner. No known ill contacts. Denies nausea, vomiting, diarrhea. She does have decreased appetite.     ROS per HPI.       Past Medical History:  Diagnosis Date  . Medical history non-contributory     Patient Active Problem List   Diagnosis Date Noted  . Amenorrhea 11/20/2015  . Rectal bleeding 06/12/2015  . 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 Depression 06/23/2014  . Anal fissure 10/20/2012  . Contraception management 08/16/2012  . Night sweats 03/26/2011  . PTSD (post-traumatic stress disorder) 02/15/2010  . Dysmenorrhea 02/13/2010    Past Surgical History:  Procedure Laterality Date  . TONSILLECTOMY      OB History    Gravida Para Term Preterm AB Living   1 1 1     1    SAB TAB Ectopic Multiple Live Births           1       Home Medications    Prior to Admission medications   Medication Sig Start Date End Date Taking? Authorizing Provider  citalopram (CELEXA) 40 MG tablet Take 1 tablet (40 mg total) by mouth daily. 12/08/16    Marquette Saa, MD  MedroxyPROGESTERone Acetate (DEPO-PROVERA IM) Inject into the muscle.    [provider]  metroNIDAZOLE (FLAGYL) 500 MG tablet Take 1 tablet (500 mg total) by mouth 2 (two) times daily. Patient not taking: Reported on 01/21/2017 04/17/16   Alene Mires, NP  Prenatal Vit-Fe Fumarate-FA (PRENATAL VITAMINS) 28-0.8 MG TABS Take 1 tablet by mouth daily. Patient not taking: Reported on 01/21/2017 08/16/13   Latrelle Dodrill, MD    Family History Family History  Problem Relation Age of Onset  . Hypertension Mother   . Hypertension Father     Social History Social History   Tobacco Use  . Smoking status: Never Smoker  . Smokeless tobacco: Never Used  Substance Use Topics  . Alcohol use: No  . Drug use: No     Allergies   Patient has no known allergies.   Review of Systems Review of Systems   Physical Exam Triage Vital Signs ED Triage Vitals  Enc Vitals Group     BP 01/21/17 1405 119/71     Pulse Rate 01/21/17 1405 68     Resp 01/21/17 1405 18     Temp 01/21/17 1405 98.6 F (37 C)     Temp src --      SpO2 01/21/17 1405 100 %     Weight --  Height --      Head Circumference --      Peak Flow --      Pain Score 01/21/17 1406 7     Pain Loc --      Pain Edu? --      Excl. in GC? --    No data found.  Updated Vital Signs BP 119/71   Pulse 68   Temp 98.6 F (37 C)   Resp 18   LMP 01/14/2017   SpO2 100%   Visual Acuity Right Eye Distance:   Left Eye Distance:   Bilateral Distance:    Right Eye Near:   Left Eye Near:    Bilateral Near:     Physical Exam  Constitutional: She is oriented to person, place, and time. She appears well-developed and well-nourished. No distress.  Cardiovascular: Normal rate, regular rhythm and normal heart sounds.  Pulmonary/Chest: Effort normal and breath sounds normal.  Abdominal: She exhibits distension. There is generalized tenderness. There is no guarding and no CVA  tenderness.  Quite significant abdominal pain on exam; patient states she is unable to jump on her feet when asked, as she states this is very painful, states she was unable to lift her daughter today due to abdominal pain   Neurological: She is alert and oriented to person, place, and time.  Skin: Skin is warm and dry.     UC Treatments / Results  Labs (all labs ordered are listed, but only abnormal results are displayed) Labs Reviewed  POCT URINALYSIS DIP (DEVICE)    EKG  EKG Interpretation None       Radiology No results found.  Procedures Procedures (including critical care time)  Medications Ordered in UC Medications - No data to display   Initial Impression / Assessment and Plan / UC Course  I have reviewed the triage vital signs and the nursing notes.  Pertinent labs & imaging results that were available during my care of the patient were reviewed by me and considered in my medical decision making (see chart for details).     Patient non toxic in appearance. Vitals stable. Ambulatory. Without vomiting or diarrhea. Recent travel to [place]. Exam with quite marked diffuse abdominal pain. Recommend further evaluation in ED due to exam today. Patient verbalized understanding and agreeable to plan.  Stable for self transfer.   Final Clinical Impressions(s) / UC Diagnoses   Final diagnoses:  Generalized abdominal pain    ED Discharge Orders    None       Controlled Substance Prescriptions  Controlled Substance Registry consulted? Not Applicable   Georgetta HaberBurky, Alondra Sahni B, NP 01/21/17 1507

## 2017-01-21 NOTE — ED Notes (Signed)
Pt assisted to restroom. Felt light headed on her feet and dizzy.

## 2017-01-24 ENCOUNTER — Other Ambulatory Visit: Payer: Self-pay

## 2017-01-24 ENCOUNTER — Ambulatory Visit: Payer: Medicaid Other | Admitting: Internal Medicine

## 2017-01-24 DIAGNOSIS — R1084 Generalized abdominal pain: Secondary | ICD-10-CM | POA: Diagnosis not present

## 2017-01-24 NOTE — Patient Instructions (Signed)
It was nice seeing you again today Ms. Roger KillHon!  Please continue taking the Bentyl and ranitidine tablets. If you do not feel better by the time you finish these medications, please call to schedule another appointment.   Be sure to drink plenty of water even if you do not feel like eating as much.   If you have any questions or concerns, please feel free to call the clinic.   Be well,  Dr. Natale MilchLancaster

## 2017-01-24 NOTE — Progress Notes (Signed)
   Subjective:   Patient: Amber Hubbard       Birthdate: 01-Oct-1990       MRN: [id number]      HPI  Amber Hubbard is a 27 y.o. female presenting for ED f/u for abdominal pain.   Abdominal pain Chronic issue. Went to ED on 01/18 for same issue. Labwork and CT abd/pelvis were all normal, so patient was sent home with 10d course of Bentyl and Zantac. Is on day 3 of course. Says symptoms have significantly improved, though have not completely resolved. Denies diarrhea, vomiting, nausea, fevers. Ha HA intermittently. Denies dysuria, hematuria. No weight loss/weight changes. Pain is described as located primarily in upper quadrants, constant, crampy, aching, and worse with eating. Says her appetite is decreased and she has not been drinking as much.   Smoking status reviewed. Patient is never smoker.   Review of Systems See HPI.     Objective:  Physical Exam  Constitutional: She is oriented to person, place, and time and well-developed, well-nourished, and in no distress.  HENT:  Head: Normocephalic and atraumatic.  Nose: Nose normal.  Mouth/Throat: Oropharynx is clear and moist. No oropharyngeal exudate.  Pulmonary/Chest: Effort normal. No respiratory distress.  Abdominal:  Mild TTP upper quadrants. No CVA tenderness. Soft, non-distended, +BS. No guarding. No masses.   Neurological: She is alert and oriented to person, place, and time.  Skin: Skin is warm and dry.  Psychiatric: Affect and judgment normal.      Assessment & Plan:  Abdominal pain Patient with multiple visits for abd pain to ED and clinic. Recent visit to ED complete with imaging with no apparent cause for symptoms. Mild TTP on exam today, however exam otherwise benign. Doubt related to recent trip to [place], as would expect diarrhea, vomiting, and/or weight loss. Less likely constipation as patient denying this. Endorses decreased appetite and difficulty drinking water however well-hydrated and well-appearing on exam. No urinary  symptoms so less likely UTI. Pregnancy ruled out in ED. Possibly psychosomatic component, given chronic nature with no identifiable cause and patient's history of depression. Improvement with Bentyl. Offered patient pelvic exam, as that is only physical exam component that has not yet been performed. Patient declining this, and saying she would prefer to complete the course of Bentyl and Zantac and see if her symptoms improve. - F/u PRN - Precepted with Dr. Leveda AnnaHensel.   Tarri AbernethyAbigail J Kaleea Penner, MD, MPH PGY-3 [facility] Pager [phone]

## 2017-01-25 ENCOUNTER — Encounter: Payer: Self-pay | Admitting: Internal Medicine

## 2017-01-25 NOTE — Assessment & Plan Note (Signed)
Patient with multiple visits for abd pain to ED and clinic. Recent visit to ED complete with imaging with no apparent cause for symptoms. Mild TTP on exam today, however exam otherwise benign. Doubt related to recent trip to [place], as would expect diarrhea, vomiting, and/or weight loss. Less likely constipation as patient denying this. Endorses decreased appetite and difficulty drinking water however well-hydrated and well-appearing on exam. No urinary symptoms so less likely UTI. Pregnancy ruled out in ED. Possibly psychosomatic component, given chronic nature with no identifiable cause and patient's history of depression. Improvement with Bentyl. Offered patient pelvic exam, as that is only physical exam component that has not yet been performed. Patient declining this, and saying she would prefer to complete the course of Bentyl and Zantac and see if her symptoms improve. - F/u PRN

## 2017-02-02 ENCOUNTER — Encounter: Payer: Self-pay | Admitting: Internal Medicine

## 2017-02-02 ENCOUNTER — Other Ambulatory Visit: Payer: Self-pay

## 2017-02-02 ENCOUNTER — Ambulatory Visit: Payer: Medicaid Other | Admitting: Internal Medicine

## 2017-02-02 ENCOUNTER — Other Ambulatory Visit (HOSPITAL_COMMUNITY)
Admission: RE | Admit: 2017-02-02 | Discharge: 2017-02-02 | Disposition: A | Discharge status: Home / Self Care | Payer: Medicaid Other | Source: Ambulatory Visit | Attending: Family Medicine | Admitting: Family Medicine

## 2017-02-02 VITALS — BP 120/75 | HR 76 | Temp 98.3°F | Wt 142.4 lb

## 2017-02-02 DIAGNOSIS — Z0000 Encounter for general adult medical examination without abnormal findings: Secondary | ICD-10-CM | POA: Diagnosis present

## 2017-02-02 NOTE — Assessment & Plan Note (Signed)
Pap smear performed today. No abnormalities on chaperoned exam.  Will call patient if abnormal results.

## 2017-02-02 NOTE — Patient Instructions (Signed)
It was nice seeing you again today Amber Hubbard!  I will call you if there are any abnormalities with your pap smear results.   If you have any questions or concerns, please feel free to call the clinic.   Be well,  Dr. Natale MilchLancaster

## 2017-02-02 NOTE — Progress Notes (Signed)
   Subjective:   Patient: Amber Hubbard       Birthdate: 06-13-1990       MRN: [id number]      HPI  Amber Hubbard is a [age] y.o. female presenting for pap smear.   Pap smear Last performed 08/17/2014 with no abnormalities. Endorses abdominal pain though this is a chronic issue for which she was most recently seen last week. Pain has not worsened and is currently controlled with Bentyl. Denies vaginal discharge. Is sexually active.   Smoking status reviewed. Patient is never smoker.   Review of Systems See HPI.     Objective:  Physical Exam  Constitutional: She is oriented to person, place, and time and well-developed, well-nourished, and in no distress.  HENT:  Head: Normocephalic and atraumatic.  Pulmonary/Chest: Effort normal. No respiratory distress.  Genitourinary: Vagina normal, uterus normal, cervix normal, right adnexa normal and left adnexa normal. No vaginal discharge found.  Neurological: She is alert and oriented to person, place, and time.  Skin: Skin is warm and dry.  Psychiatric: Affect and judgment normal.      Assessment & Plan:  Healthcare maintenance Pap smear performed today. No abnormalities on chaperoned exam.  Will call patient if abnormal results.    Tarri AbernethyAbigail J [place], MD, MPH PGY-3 [facility] Pager [phone]

## 2017-02-04 LAB — CYTOLOGY - PAP: DIAGNOSIS: NEGATIVE

## 2017-05-13 ENCOUNTER — Encounter: Payer: Self-pay | Admitting: Internal Medicine

## 2017-05-13 ENCOUNTER — Ambulatory Visit (INDEPENDENT_AMBULATORY_CARE_PROVIDER_SITE_OTHER): Payer: Medicaid Other | Admitting: Internal Medicine

## 2017-05-13 ENCOUNTER — Other Ambulatory Visit: Payer: Self-pay

## 2017-05-13 DIAGNOSIS — H6123 Impacted cerumen, bilateral: Secondary | ICD-10-CM

## 2017-05-13 NOTE — Patient Instructions (Signed)
It was nice seeing you again today Amber Hubbard!  If you feel that your ear wax is starting to build up again, please schedule another appointment and we will be happy to clean your ears out again. Do not try to remove the wax yourself, as this could push the wax further into your ear and make it easier to remove, or cause ear pain.   If you continue to have hearing issues or ringing in your ear even now that the wax has been removed, please let us know.   If you have any questions or concerns, please feel free to call the clinic.   Be well,  Dr. Natale Milch

## 2017-05-13 NOTE — Assessment & Plan Note (Signed)
Likely cause of diminished hearing, as patient reporting immediate improvement in hearing after irrigation. Discussed importance of not attempting to clean her ears at home, and encouraged patient to schedule an appointment if she is concerned about another impaction. Also discussed returning of diminished hearing or ringing continues despite irrigation.

## 2017-05-13 NOTE — Progress Notes (Signed)
   Subjective:   Patient: Amber Hubbard       Birthdate: 02-Jun-1990       MRN: [id number]      HPI  Amber Hubbard is a 27 y.o. female presenting for cerumen build up.   Excessive cerumen Patient says two years ago she was told that she has excessive cerumen. At that time, she was having decreased hearing, which immediately improved after having her ears irrigated at [facility]. She says over the past month, she has begun to gradually experience these sx again. Endorses bilateral diminished hearing as well as occasional ringing in her ears. Patient says she does try to clean out her ears at home with water but never uses Q tips. Denies bleeding or drainage from her ears. Denies fevers or any other sx. Generally feels very well and has no other complaints today.   Smoking status reviewed. Patient is never smoker.   Review of Systems See HPI.     Objective:  Physical Exam  Constitutional: She is oriented to person, place, and time. She appears well-developed and well-nourished. No distress.  HENT:  Head: Normocephalic and atraumatic.  Bilateral cerumen impaction prior to irrigation.  After irrigation, minimal amount of cerumen remaining in L canal, no cerumen remaining in R. TMs clearly visualized with no bulging or erythema. NL hearing bilaterally.   Pulmonary/Chest: Effort normal. No respiratory distress.  Neurological: She is alert and oriented to person, place, and time.  Skin: Skin is warm and dry.  Psychiatric: She has a normal mood and affect. Her behavior is normal.      Assessment & Plan:  Cerumen impaction Likely cause of diminished hearing, as patient reporting immediate improvement in hearing after irrigation. Discussed importance of not attempting to clean her ears at home, and encouraged patient to schedule an appointment if she is concerned about another impaction. Also discussed returning of diminished hearing or ringing continues despite irrigation.    Tarri Abernethy, MD,  MPH PGY-3 [facility] Pager [phone]

## 2017-09-09 ENCOUNTER — Ambulatory Visit: Payer: Medicaid Other | Admitting: Family Medicine

## 2017-09-09 ENCOUNTER — Emergency Department (HOSPITAL_COMMUNITY): Payer: Medicaid Other

## 2017-09-09 ENCOUNTER — Other Ambulatory Visit: Payer: Self-pay

## 2017-09-09 ENCOUNTER — Emergency Department (HOSPITAL_COMMUNITY)
Admission: EM | Admit: 2017-09-09 | Discharge: 2017-09-10 | Disposition: A | Discharge status: Home / Self Care | Payer: Medicaid Other | Attending: Emergency Medicine | Admitting: Emergency Medicine

## 2017-09-09 ENCOUNTER — Encounter: Payer: Self-pay | Admitting: Family Medicine

## 2017-09-09 ENCOUNTER — Encounter (HOSPITAL_COMMUNITY): Payer: Self-pay | Admitting: Emergency Medicine

## 2017-09-09 VITALS — BP 104/70 | HR 66 | Temp 98.3°F | Wt 135.8 lb

## 2017-09-09 DIAGNOSIS — Z5321 Procedure and treatment not carried out due to patient leaving prior to being seen by health care provider: Secondary | ICD-10-CM | POA: Insufficient documentation

## 2017-09-09 DIAGNOSIS — R51 Headache: Secondary | ICD-10-CM

## 2017-09-09 DIAGNOSIS — R61 Generalized hyperhidrosis: Secondary | ICD-10-CM | POA: Diagnosis not present

## 2017-09-09 DIAGNOSIS — R519 Headache, unspecified: Secondary | ICD-10-CM | POA: Insufficient documentation

## 2017-09-09 LAB — URINALYSIS, ROUTINE W REFLEX MICROSCOPIC
BILIRUBIN URINE: NEGATIVE
Glucose, UA: NEGATIVE mg/dL
Hgb urine dipstick: NEGATIVE
Ketones, ur: NEGATIVE mg/dL
LEUKOCYTES UA: NEGATIVE
NITRITE: NEGATIVE
PH: 6 (ref 5.0–8.0)
Protein, ur: NEGATIVE mg/dL
Specific Gravity, Urine: 1.028 (ref 1.005–1.030)

## 2017-09-09 LAB — CBC
HEMATOCRIT: 41.1 % (ref 36.0–46.0)
Hemoglobin: 13 g/dL (ref 12.0–15.0)
MCH: 24.7 pg — ABNORMAL LOW (ref 26.0–34.0)
MCHC: 31.6 g/dL (ref 30.0–36.0)
MCV: 78 fL (ref 78.0–100.0)
PLATELETS: 291 10*3/uL (ref 150–400)
RBC: 5.27 MIL/uL — ABNORMAL HIGH (ref 3.87–5.11)
RDW: 14.6 % (ref 11.5–15.5)
WBC: 8.1 10*3/uL (ref 4.0–10.5)

## 2017-09-09 LAB — BASIC METABOLIC PANEL
Anion gap: 11 (ref 5–15)
BUN: 16 mg/dL (ref 6–20)
CO2: 27 mmol/L (ref 22–32)
CREATININE: 0.92 mg/dL (ref 0.44–1.00)
Calcium: 9.3 mg/dL (ref 8.9–10.3)
Chloride: 101 mmol/L (ref 98–111)
GFR calc Af Amer: >60 mL/min (ref >60)
GLUCOSE: 92 mg/dL (ref 70–99)
Potassium: 4.1 mmol/L (ref 3.5–5.1)
SODIUM: 139 mmol/L (ref 135–145)

## 2017-09-09 LAB — I-STAT BETA HCG BLOOD, ED (MC, WL, AP ONLY): I-stat hCG, quantitative: <5 m[IU]/mL (ref <5)

## 2017-09-09 LAB — POCT URINE PREGNANCY: PREG TEST UR: NEGATIVE

## 2017-09-09 NOTE — ED Notes (Signed)
No answer in waiting room for treatment room.  Tried calling pt's cell phone and she doesn't answer.

## 2017-09-09 NOTE — ED Triage Notes (Signed)
Pt presents with headache x 2 wks with associated dizziness, near-syncope, nausea, fatigue; pt states she saw her PCP today who instructed her to come here for CT Head; pt denies head trauma, denies LOC; no focal neuro deficits noted

## 2017-09-09 NOTE — Progress Notes (Signed)
   Subjective:    Patient ID: Amber Hubbard, female    DOB: [birth year], 27 y.o.   MRN: [id number]   CC: headache   HPI: Headache  Patient presenting her symptoms of headache and dizziness x2 weeks.  Patient reports that she has had persistent headaches that are all day long 2 weeks.  Patient states that she has tried taking Tylenol and Advil with no improvement.  Patient reports that she also has associated dizziness with the room is spinning.  Patient endorses associated nausea but denies vomiting.  Patient does report photophobia and intermittent phonophobia as well.  Patient also has some blurry vision with this sometimes is difficulty driving.  Patient has 1 cup of coffee in the morning notes that this does not help headaches.  Patient is increasing water intake and drinks at least 3 16 ounce water bottles a day. Along with headaches and dizziness patient has also been having daily night sweats x1year. Patient reports night sweats are so heavy she needs to change her clothes and sheets daily. Patient denies weight loss. Denies fever, but does have intermittent chills. Patient had travel to [place] 7 months ago but not before onset of night sweats. Patient has no other contact with anyone who traveled outside the country, homeless, or others exposed to TB. Patient has a history of latent TB but was treated with INH and completed treatment. No family h/o cancer.   Objective:  BP 104/70   Pulse 66   Temp 98.3 F (36.8 C) (Oral)   Wt 135 lb 12.8 oz (61.6 kg)   LMP 08/16/2017   SpO2 99%   BMI 27.43 kg/m  Vitals and nursing note reviewed  General: well nourished, in no acute distress HEENT: normocephalic, PERRL, EOMI, no scleral icterus or conjunctival pallor, no nasal discharge, moist mucous membranes, good dentition without erythema or discharge noted in posterior oropharynx Neck: supple, non-tender, without lymphadenopathy Cardiac: RRR, clear S1 and S2, no murmurs, rubs, or  gallops Respiratory: clear to auscultation bilaterally, no increased work of breathing Abdomen: soft, nontender, nondistended, no masses or organomegaly. Bowel sounds present Extremities: no edema or cyanosis. Warm, well perfused. 2+ radial and PT pulses bilaterally Skin: warm and dry, no rashes noted Neuro: alert and oriented, no focal deficits, CN 2-12 intact, no finger to nose dysmetria    Assessment & Plan:    Headache Patient with worsening headaches x 2 weeks without improvement with tylenol or ibuprofen. No prior h/o migraines. Neuro exam without deficits. Unclear etiology at this time. Possibly migraine headaches, however given that there is no improvement and headaches have been constant for 2 weeks cannot rule out intracranial pathology. Given that headaches are not improving, and patient with current headache will plan to obtain CT imaging of head. Unable to obtain outpatient CT so will send patient to ED. Spoke to [place], Charity fundraiser at ED, to give warm handoff on patient. Patient refusing EMS transport.   Night sweats Unclear etiology. Can consider TSH and CBC in the future, however, unable to obtain today as patient needed to go to ED for CT head   Precepted patient with Dr. Lum Babe   Return in about 1 week (around 09/16/2017) for ED follow up.   Oralia Manis, DO, PGY-2

## 2017-09-09 NOTE — ED Notes (Signed)
NO answer in waiting area

## 2017-09-09 NOTE — Patient Instructions (Signed)
It was a pleasure seeing you today.   Today we discussed your headaches  For your headaches: please go the the emergency room straight from our office for a head CT.   Our clinic's number is [phone]. Please call with questions or concerns.   Thank you,  Oralia Manis, DO

## 2017-09-09 NOTE — Assessment & Plan Note (Signed)
Unclear etiology. Can consider TSH and CBC in the future, however, unable to obtain today as patient needed to go to ED for CT head

## 2017-09-09 NOTE — ED Notes (Signed)
Pts name called for a room no answer 

## 2017-09-09 NOTE — Assessment & Plan Note (Addendum)
Patient with worsening headaches x 2 weeks without improvement with tylenol or ibuprofen. No prior h/o migraines. Neuro exam without deficits. Unclear etiology at this time. Possibly migraine headaches, however given that there is no improvement and headaches have been constant for 2 weeks cannot rule out intracranial pathology. Given that headaches are not improving, and patient with current headache will plan to obtain CT imaging of head. Unable to obtain outpatient CT so will send patient to ED. Spoke to [place], Charity fundraiser at ED, to give warm handoff on patient. Patient refusing EMS transport.

## 2017-09-14 ENCOUNTER — Ambulatory Visit: Payer: Medicaid Other | Admitting: Family Medicine

## 2017-09-15 ENCOUNTER — Other Ambulatory Visit: Payer: Self-pay

## 2017-09-15 ENCOUNTER — Ambulatory Visit: Payer: Medicaid Other | Admitting: Family Medicine

## 2017-09-15 VITALS — BP 112/68 | HR 72 | Temp 99.0°F | Ht 59.0 in | Wt 135.2 lb

## 2017-09-15 DIAGNOSIS — Z23 Encounter for immunization: Secondary | ICD-10-CM

## 2017-09-15 DIAGNOSIS — R519 Headache, unspecified: Secondary | ICD-10-CM

## 2017-09-15 DIAGNOSIS — R51 Headache: Secondary | ICD-10-CM | POA: Diagnosis present

## 2017-09-15 MED ORDER — KETOROLAC TROMETHAMINE 60 MG/2ML IM SOLN
60.0000 mg | Freq: Once | INTRAMUSCULAR | Status: AC
  Administered 2017-09-15: 60 mg via INTRAMUSCULAR

## 2017-09-15 MED ORDER — SUMATRIPTAN SUCCINATE 50 MG PO TABS: 50.0000 mg | ORAL_TABLET | ORAL | 0 refills | Status: AC | PRN

## 2017-09-15 NOTE — Patient Instructions (Signed)
Thank you for coming in to see us today. Please see below to review our plan for today's visit.  I am not certain what is causing her headache.  It is reassuring that it improved with use of Excedrin.  I would however like you to discontinue this medication and all other caffeine including coffee, tea, chocolate.  We have given you a shot of Toradol which should help resolve your headache if this is a tension headache.  If you continue having pain after 24 hours, begin the Imitrex which I sent into the pharmacy.  This weekly treats migraines.  Follow-up in 1 week to reassess your headache.  If you continue having persistent headaches, we will need to do more investigation.  If your headache worsens, you develop a high grade fever >101F, have weakness or change in speech or vision, I would encourage you to go to the emergency room to be evaluated.  Please call the clinic at [phone] if your symptoms worsen or you have any concerns. It was our pleasure to serve you.  Durward Parcelavid Cathe Bilger, DO [facility], PGY-3

## 2017-09-15 NOTE — Progress Notes (Signed)
Subjective   Patient ID: Amber Hubbard    DOB: 1990/12/13, 27 y.o. female   MRN: [id number]  CC: "Head still hurts"  HPI: Amber Hubbard is a 27 y.o. female who presents to clinic today for the following:  HEADACHE  Headache started 3 weeks ago Pain is aching and trobbing Severity: 10/10, improves 2/10 but never resolves Location: "all over" Medications tried: Tylenol and Advil and Caffeine (helps for a few hours)  Head trauma: No Sudden onset: Yes Previous similar headaches: No Taking blood thinners: No History of cancer: No  Symptoms Nose congestion stuffiness: No Nausea vomiting: Some nausea no vomiting Photophobia: No Noise sensitivity: No Double vision or loss of vision: No, does have blurred vision Fever: Yes, 101F Neck Stiffness: No Trouble walking or speaking: No  Of note, patient was seen at clinic on 09/09/2017 for same complaint.  She was doctor to go to [facility] ED for further evaluation.  Patient did receive a head CT which was unremarkable but left before seeing a provider.  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  She does report some fevers and night sweats as high as 101F.  She however is afebrile today.  Patient has been taking Excedrin with improvement of symptoms.  She reports her mother having history of migraines.  She has no history of prior headaches.  She is up-to-date on her vaccinations.  ROS: see HPI for pertinent.  PMFSH: PTSD, AUB, anal fissure, headaches, latent TB.  Surgical history tonsillectomy.  Family history HTN.  Smoking status reviewed. Medications reviewed.  Objective   BP 112/68   Pulse 72   Temp 99 F (37.2 C) (Oral)   Ht 4\' 11"  (1.499 m)   Wt 135 lb 3.2 oz (61.3 kg)   LMP 08/16/2017   SpO2 98%   BMI 27.31 kg/m  Vitals and nursing note reviewed.  General: well nourished, well developed, NAD with non-toxic  appearance HEENT: normocephalic, atraumatic, moist mucous membranes, PERRLA, EOMI Neck: supple, non-tender without lymphadenopathy, no nuchal rigidity Cardiovascular: regular rate and rhythm without murmurs, rubs, or gallops Lungs: clear to auscultation bilaterally with normal work of breathing Abdomen: soft, non-tender, non-distended, normoactive bowel sounds Skin: warm, dry, no rashes or lesions, cap refill < 2 seconds Extremities: warm and well perfused, normal tone, no edema Neuro: CNII-XII intact, fine motor intact, no dysarthria, stable gait, no facial droop or ptosis  Assessment & Plan   Headache Subacute.  Uncertain etiology.  Suspicious for tension headache versus rebound headache given caffeine use and improvement with Excedrin.  Does have a family history of migraines.  Differential does include infectious etiologies given history of TB which was adequately treated.  CT head unremarkable.  Would consider epidural abscess given report of fevers though patient is afebrile today.  No meningeal signs making meningitis unlikely. - Given 60 mg shot of Toradol with instructions to take Imitrex 50 mg every 2 hours as needed for headache if pain does not improve after 24 hours - Follow-up in 1 week for reassessment - Reviewed return precautions  Orders Placed This Encounter  Procedures  . Flu Vaccine QUAD 36+ mos IM   Meds ordered this encounter  Medications  . SUMAtriptan (IMITREX) 50 MG tablet    Sig: Take 1 tablet (50 mg total) by mouth every 2 (two) hours as needed for migraine. May repeat in 2 hours if headache  persists or recurs.    Dispense:  10 tablet    Refill:  0  . ketorolac (TORADOL) injection 60 mg    Durward [place] Symeon Puleo, DO [facility], PGY-3 09/15/2017, 12:24 PM

## 2017-09-15 NOTE — Assessment & Plan Note (Signed)
Subacute.  Uncertain etiology.  Suspicious for tension headache versus rebound headache given caffeine use and improvement with Excedrin.  Does have a family history of migraines.  Differential does include infectious etiologies given history of TB which was adequately treated.  CT head unremarkable.  Would consider epidural abscess given report of fevers though patient is afebrile today.  No meningeal signs making meningitis unlikely. - Given 60 mg shot of Toradol with instructions to take Imitrex 50 mg every 2 hours as needed for headache if pain does not improve after 24 hours - Follow-up in 1 week for reassessment - Reviewed return precautions

## 2017-11-16 ENCOUNTER — Other Ambulatory Visit: Payer: Self-pay | Admitting: Internal Medicine

## 2017-11-16 DIAGNOSIS — F329 Major depressive disorder, single episode, unspecified: Secondary | ICD-10-CM

## 2017-11-16 DIAGNOSIS — F32A Depression, unspecified: Secondary | ICD-10-CM

## 2017-11-16 DIAGNOSIS — F4310 Post-traumatic stress disorder, unspecified: Secondary | ICD-10-CM

## 2018-04-20 ENCOUNTER — Other Ambulatory Visit: Payer: Self-pay | Admitting: *Deleted

## 2018-04-20 DIAGNOSIS — F32A Depression, unspecified: Secondary | ICD-10-CM

## 2018-04-20 DIAGNOSIS — F329 Major depressive disorder, single episode, unspecified: Secondary | ICD-10-CM

## 2018-04-20 DIAGNOSIS — F4310 Post-traumatic stress disorder, unspecified: Secondary | ICD-10-CM

## 2018-04-21 MED ORDER — CITALOPRAM HYDROBROMIDE 40 MG PO TABS: ORAL_TABLET | ORAL | 0 refills | Status: DC

## 2018-07-20 ENCOUNTER — Other Ambulatory Visit: Payer: Self-pay | Admitting: Family Medicine

## 2018-07-20 DIAGNOSIS — F329 Major depressive disorder, single episode, unspecified: Secondary | ICD-10-CM

## 2018-07-20 DIAGNOSIS — F4310 Post-traumatic stress disorder, unspecified: Secondary | ICD-10-CM

## 2018-07-20 DIAGNOSIS — F32A Depression, unspecified: Secondary | ICD-10-CM

## 2018-08-15 ENCOUNTER — Other Ambulatory Visit: Payer: Self-pay | Admitting: Family Medicine

## 2018-08-15 DIAGNOSIS — F4310 Post-traumatic stress disorder, unspecified: Secondary | ICD-10-CM

## 2018-08-15 DIAGNOSIS — F32A Depression, unspecified: Secondary | ICD-10-CM

## 2018-08-15 DIAGNOSIS — F329 Major depressive disorder, single episode, unspecified: Secondary | ICD-10-CM

## 2019-02-03 ENCOUNTER — Other Ambulatory Visit: Payer: Self-pay | Admitting: Family Medicine

## 2019-02-03 DIAGNOSIS — F32A Depression, unspecified: Secondary | ICD-10-CM

## 2019-02-03 DIAGNOSIS — F329 Major depressive disorder, single episode, unspecified: Secondary | ICD-10-CM

## 2019-02-03 DIAGNOSIS — F4310 Post-traumatic stress disorder, unspecified: Secondary | ICD-10-CM

## 2019-05-02 ENCOUNTER — Other Ambulatory Visit: Payer: Self-pay | Admitting: *Deleted

## 2019-05-02 DIAGNOSIS — F329 Major depressive disorder, single episode, unspecified: Secondary | ICD-10-CM

## 2019-05-02 DIAGNOSIS — F4310 Post-traumatic stress disorder, unspecified: Secondary | ICD-10-CM

## 2019-05-02 DIAGNOSIS — F32A Depression, unspecified: Secondary | ICD-10-CM

## 2019-05-03 MED ORDER — CITALOPRAM HYDROBROMIDE 40 MG PO TABS: ORAL_TABLET | ORAL | 0 refills | Status: DC

## 2019-07-31 ENCOUNTER — Other Ambulatory Visit: Payer: Self-pay | Admitting: Family Medicine

## 2019-07-31 DIAGNOSIS — F32A Depression, unspecified: Secondary | ICD-10-CM

## 2019-07-31 DIAGNOSIS — F4310 Post-traumatic stress disorder, unspecified: Secondary | ICD-10-CM

## 2019-07-31 NOTE — Telephone Encounter (Signed)
Patient has not been seen by our clinic since 2019. She will need to be seen prior to further refills. I will provide her with a short course to get her through until she can be seen, after that I will no longer refill. If it is difficult for her to be seen in person, virtual is okay for now. Thank you.

## 2019-08-02 NOTE — Telephone Encounter (Signed)
Spoke to patient and she no longer resides in town.  Informed her that she should seek another PCP in her area.  Patient was appreciative of the information and said that she was not aware.  Glennie Hawk, CMA

## 2023-11-24 ENCOUNTER — Other Ambulatory Visit: Payer: Self-pay

## 2023-11-24 ENCOUNTER — Emergency Department (HOSPITAL_BASED_OUTPATIENT_CLINIC_OR_DEPARTMENT_OTHER)
Admission: EM | Admit: 2023-11-24 | Discharge: 2023-11-24 | Disposition: A | Discharge status: Home / Self Care | Attending: Emergency Medicine | Admitting: Emergency Medicine

## 2023-11-24 ENCOUNTER — Emergency Department (HOSPITAL_BASED_OUTPATIENT_CLINIC_OR_DEPARTMENT_OTHER)

## 2023-11-24 ENCOUNTER — Encounter (HOSPITAL_BASED_OUTPATIENT_CLINIC_OR_DEPARTMENT_OTHER): Payer: Self-pay | Admitting: Urology

## 2023-11-24 DIAGNOSIS — G8929 Other chronic pain: Secondary | ICD-10-CM | POA: Diagnosis not present

## 2023-11-24 DIAGNOSIS — M5441 Lumbago with sciatica, right side: Secondary | ICD-10-CM | POA: Diagnosis present

## 2023-11-24 DIAGNOSIS — M5442 Lumbago with sciatica, left side: Secondary | ICD-10-CM | POA: Diagnosis not present

## 2023-11-24 LAB — URINALYSIS, MICROSCOPIC (REFLEX)

## 2023-11-24 LAB — URINALYSIS, ROUTINE W REFLEX MICROSCOPIC
Bilirubin Urine: NEGATIVE
Glucose, UA: NEGATIVE mg/dL
Ketones, ur: NEGATIVE mg/dL
Leukocytes,Ua: NEGATIVE
Nitrite: NEGATIVE
Protein, ur: NEGATIVE mg/dL
Specific Gravity, Urine: >=1.03 (ref 1.005–1.030)
pH: 5.5 (ref 5.0–8.0)

## 2023-11-24 LAB — PREGNANCY, URINE: Preg Test, Ur: NEGATIVE

## 2023-11-24 MED ORDER — METHOCARBAMOL 500 MG PO TABS
500.0000 mg | ORAL_TABLET | Freq: Once | ORAL | Status: AC
  Administered 2023-11-24: 500 mg via ORAL
  Filled 2023-11-24: qty 1

## 2023-11-24 MED ORDER — NAPROXEN 500 MG PO TABS
500.0000 mg | ORAL_TABLET | Freq: Two times a day (BID) | ORAL | 0 refills | Status: AC
Start: 2023-11-24 — End: ?

## 2023-11-24 MED ORDER — KETOROLAC TROMETHAMINE 60 MG/2ML IM SOLN
30.0000 mg | Freq: Once | INTRAMUSCULAR | Status: AC
  Administered 2023-11-24: 30 mg via INTRAMUSCULAR
  Filled 2023-11-24: qty 2

## 2023-11-24 MED ORDER — METHOCARBAMOL 500 MG PO TABS: 500.0000 mg | ORAL_TABLET | Freq: Two times a day (BID) | ORAL | 0 refills | Status: AC

## 2023-11-24 NOTE — ED Provider Notes (Signed)
 [place] [facility] AT [facility]  Provider Note  CSN: [id number] Arrival date & time: 11/24/23 1846  History Chief Complaint  Patient presents with   Back Pain    Amber Hubbard is a 33 y.o. female with history of chronic back pain, has been to Spine and had negative xrays. She has been on steroids intermittently, as recently as a week ago, and is currently doing outpatient PT. She reports pain returned this morning while lifting her son. She reports pain in midline lower back radiating down both legs. No weakness or numbness. No incontinence.    Home Medications Prior to Admission medications   Medication Sig Start Date End Date Taking? Authorizing Provider  methocarbamol (ROBAXIN) 500 MG tablet Take 1 tablet (500 mg total) by mouth 2 (two) times daily. 11/24/23  Yes Roselyn Carlin NOVAK, MD  naproxen (NAPROSYN) 500 MG tablet Take 1 tablet (500 mg total) by mouth 2 (two) times daily. 11/24/23  Yes Roselyn Carlin NOVAK, MD  acetaminophen  (TYLENOL ) 325 MG tablet Take 650 mg by mouth every 6 (six) hours as needed for headache (pain).    [provider]  butalbital -aspirin-caffeine  (FIORINAL) 50-325-40 MG capsule TAKE ONE CAPUSLE THREE TIMES DAILY AS NEEDED 09/12/17   [provider]  citalopram  (CELEXA ) 40 MG tablet TAKE 1 TABLET(40 MG) BY MOUTH DAILY 07/31/19   Mullis, Kiersten P, DO  olopatadine (PATANOL) 0.1 % ophthalmic solution Place one drop into the left eye 2 (two) times daily. 04/29/17   [provider]  ondansetron  (ZOFRAN  ODT) 4 MG disintegrating tablet Take 1 tablet (4 mg total) by mouth every 8 (eight) hours as needed for nausea or vomiting. Patient not taking: Reported on 09/15/2017 01/21/17   Angelena Smalls, MD  SUMAtriptan  (IMITREX ) 50 MG tablet Take 1 tablet (50 mg total) by mouth every 2 (two) hours as needed for migraine. May repeat in 2 hours if headache persists or recurs. 09/15/17   McMullen, David, DO     Allergies    Patient  has no known allergies.   Review of Systems   Review of Systems Please see HPI for pertinent positives and negatives  Physical Exam BP 124/76 (BP Location: Right Arm)   Pulse 93   Temp 97.7 F (36.5 C)   Resp 16   Ht 4' 11 (1.499 m)   Wt 61.3 kg   LMP 11/22/2023   SpO2 100%   BMI 27.30 kg/m   Physical Exam Vitals and nursing note reviewed.  HENT:     Head: Normocephalic.     Nose: Nose normal.  Eyes:     Extraocular Movements: Extraocular movements intact.  Pulmonary:     Effort: Pulmonary effort is normal.  Musculoskeletal:        General: Tenderness (midline lumosacral spine) present. No swelling or deformity. Normal range of motion.     Cervical back: Neck supple.  Skin:    Findings: No rash (on exposed skin).  Neurological:     Mental Status: She is alert and oriented to person, place, and time.     Sensory: No sensory deficit.     Motor: No weakness.  Psychiatric:        Mood and Affect: Mood normal.     ED Results / Procedures / Treatments   EKG None  Procedures Procedures  Medications Ordered in the ED Medications  ketorolac  (TORADOL ) injection 30 mg (has no administration in time range)  methocarbamol (ROBAXIN) tablet 500 mg (has no administration  in time range)    Initial Impression and Plan  Patient here with exacerbation of chronic back pain. No red flags. Labs and imaging ordered in triage with neg UA, hcg and lumbar xray. Will give toradol , robaxin Rx for naprosyn/robaxin and recommend outpatient follow up.   ED Course       MDM Rules/Calculators/A&P Medical Decision Making Problems Addressed: Chronic midline low back pain with bilateral sciatica: chronic illness or injury with exacerbation, progression, or side effects of treatment  Amount and/or Complexity of Data Reviewed Labs: ordered. Decision-making details documented in ED Course. Radiology: ordered and independent interpretation performed. Decision-making details documented  in ED Course.  Risk Prescription drug management.     Final Clinical Impression(s) / ED Diagnoses Final diagnoses:  Chronic midline low back pain with bilateral sciatica    Rx / DC Orders ED Discharge Orders          Ordered    naproxen (NAPROSYN) 500 MG tablet  2 times daily        11/24/23 2309    methocarbamol (ROBAXIN) 500 MG tablet  2 times daily        11/24/23 2309             Roselyn Carlin NOVAK, MD 11/24/23 [phone]

## 2023-11-24 NOTE — ED Triage Notes (Signed)
 Pt state lower back pain on and off for 5 years  Pain returned this am when lifting toddler  Dropped to knees and couldn't stand  Taking motrin  with no relief
